# Patient Record
Sex: Female | Born: 1965 | Race: White | Hispanic: No | Marital: Married | Smoking: Never smoker
Health system: Southern US, Community
[De-identification: ages and names within clinical notes are randomized; demographics above are authoritative.]

## PROBLEM LIST (undated history)

## (undated) DIAGNOSIS — Z9882 Breast implant status: Secondary | ICD-10-CM

## (undated) DIAGNOSIS — R011 Cardiac murmur, unspecified: Secondary | ICD-10-CM

## (undated) DIAGNOSIS — K5790 Diverticulosis of intestine, part unspecified, without perforation or abscess without bleeding: Secondary | ICD-10-CM

## (undated) DIAGNOSIS — Z79890 Hormone replacement therapy (postmenopausal): Secondary | ICD-10-CM

## (undated) DIAGNOSIS — C4492 Squamous cell carcinoma of skin, unspecified: Secondary | ICD-10-CM

## (undated) DIAGNOSIS — S39012A Strain of muscle, fascia and tendon of lower back, initial encounter: Secondary | ICD-10-CM

## (undated) DIAGNOSIS — K219 Gastro-esophageal reflux disease without esophagitis: Secondary | ICD-10-CM

## (undated) DIAGNOSIS — N920 Excessive and frequent menstruation with regular cycle: Secondary | ICD-10-CM

## (undated) DIAGNOSIS — E039 Hypothyroidism, unspecified: Secondary | ICD-10-CM

## (undated) DIAGNOSIS — J45909 Unspecified asthma, uncomplicated: Secondary | ICD-10-CM

## (undated) HISTORY — DX: Squamous cell carcinoma of skin, unspecified: C44.92

## (undated) HISTORY — DX: Strain of muscle, fascia and tendon of lower back, initial encounter: S39.012A

## (undated) HISTORY — DX: Diverticulosis of intestine, part unspecified, without perforation or abscess without bleeding: K57.90

## (undated) HISTORY — DX: Breast implant status: Z98.82

## (undated) HISTORY — DX: Gastro-esophageal reflux disease without esophagitis: K21.9

## (undated) HISTORY — DX: Excessive and frequent menstruation with regular cycle: N92.0

## (undated) HISTORY — DX: Hormone replacement therapy: Z79.890

## (undated) HISTORY — DX: Hypothyroidism, unspecified: E03.9

## (undated) HISTORY — DX: Cardiac murmur, unspecified: R01.1

## (undated) HISTORY — DX: Unspecified asthma, uncomplicated: J45.909

---

## 1988-09-05 HISTORY — PX: PLACEMENT OF BREAST IMPLANTS: SHX6334

## 2010-11-16 ENCOUNTER — Other Ambulatory Visit (HOSPITAL_COMMUNITY)
Admission: RE | Admit: 2010-11-16 | Discharge: 2010-11-16 | Disposition: A | Discharge status: Home / Self Care | Payer: BC Managed Care – PPO | Source: Ambulatory Visit | Attending: Internal Medicine | Admitting: Internal Medicine

## 2010-11-16 DIAGNOSIS — Z01419 Encounter for gynecological examination (general) (routine) without abnormal findings: Secondary | ICD-10-CM | POA: Insufficient documentation

## 2010-11-17 ENCOUNTER — Other Ambulatory Visit: Payer: Self-pay | Admitting: Family Medicine

## 2010-11-17 DIAGNOSIS — Z1231 Encounter for screening mammogram for malignant neoplasm of breast: Secondary | ICD-10-CM

## 2011-06-21 ENCOUNTER — Emergency Department (HOSPITAL_COMMUNITY): Payer: BC Managed Care – PPO

## 2011-06-21 ENCOUNTER — Emergency Department (HOSPITAL_COMMUNITY)
Admission: EM | Admit: 2011-06-21 | Discharge: 2011-06-22 | Disposition: A | Discharge status: Home / Self Care | Payer: BC Managed Care – PPO | Attending: Emergency Medicine | Admitting: Emergency Medicine

## 2011-06-21 DIAGNOSIS — S93409A Sprain of unspecified ligament of unspecified ankle, initial encounter: Secondary | ICD-10-CM | POA: Insufficient documentation

## 2011-06-21 DIAGNOSIS — R011 Cardiac murmur, unspecified: Secondary | ICD-10-CM | POA: Insufficient documentation

## 2011-06-21 DIAGNOSIS — E039 Hypothyroidism, unspecified: Secondary | ICD-10-CM | POA: Insufficient documentation

## 2011-06-21 DIAGNOSIS — M542 Cervicalgia: Secondary | ICD-10-CM | POA: Insufficient documentation

## 2011-06-21 DIAGNOSIS — S139XXA Sprain of joints and ligaments of unspecified parts of neck, initial encounter: Secondary | ICD-10-CM | POA: Insufficient documentation

## 2011-06-21 DIAGNOSIS — W108XXA Fall (on) (from) other stairs and steps, initial encounter: Secondary | ICD-10-CM | POA: Insufficient documentation

## 2011-06-21 DIAGNOSIS — M25579 Pain in unspecified ankle and joints of unspecified foot: Secondary | ICD-10-CM | POA: Insufficient documentation

## 2011-06-21 DIAGNOSIS — Y92009 Unspecified place in unspecified non-institutional (private) residence as the place of occurrence of the external cause: Secondary | ICD-10-CM | POA: Insufficient documentation

## 2011-11-21 ENCOUNTER — Other Ambulatory Visit (HOSPITAL_COMMUNITY)
Admission: RE | Admit: 2011-11-21 | Discharge: 2011-11-21 | Disposition: A | Discharge status: Home / Self Care | Payer: BC Managed Care – PPO | Source: Ambulatory Visit | Attending: Family Medicine | Admitting: Family Medicine

## 2011-11-21 DIAGNOSIS — Z01419 Encounter for gynecological examination (general) (routine) without abnormal findings: Secondary | ICD-10-CM | POA: Insufficient documentation

## 2012-11-26 ENCOUNTER — Other Ambulatory Visit (HOSPITAL_COMMUNITY)
Admission: RE | Admit: 2012-11-26 | Discharge: 2012-11-26 | Disposition: A | Discharge status: Home / Self Care | Payer: BC Managed Care – PPO | Source: Ambulatory Visit | Attending: Family Medicine | Admitting: Family Medicine

## 2012-11-26 ENCOUNTER — Other Ambulatory Visit: Payer: Self-pay | Admitting: Family Medicine

## 2012-11-26 DIAGNOSIS — Z01419 Encounter for gynecological examination (general) (routine) without abnormal findings: Secondary | ICD-10-CM | POA: Insufficient documentation

## 2012-11-27 ENCOUNTER — Other Ambulatory Visit: Payer: Self-pay | Admitting: Family Medicine

## 2012-11-27 DIAGNOSIS — Z1231 Encounter for screening mammogram for malignant neoplasm of breast: Secondary | ICD-10-CM

## 2013-12-17 ENCOUNTER — Other Ambulatory Visit: Payer: Self-pay | Admitting: Family Medicine

## 2013-12-17 DIAGNOSIS — Z1231 Encounter for screening mammogram for malignant neoplasm of breast: Secondary | ICD-10-CM

## 2014-01-22 ENCOUNTER — Ambulatory Visit
Admission: RE | Admit: 2014-01-22 | Discharge: 2014-01-22 | Disposition: A | Discharge status: Home / Self Care | Payer: 59 | Source: Ambulatory Visit | Attending: Family Medicine | Admitting: Family Medicine

## 2014-01-22 ENCOUNTER — Encounter (INDEPENDENT_AMBULATORY_CARE_PROVIDER_SITE_OTHER): Payer: Self-pay

## 2014-01-22 DIAGNOSIS — Z1231 Encounter for screening mammogram for malignant neoplasm of breast: Secondary | ICD-10-CM

## 2014-03-27 ENCOUNTER — Encounter (INDEPENDENT_AMBULATORY_CARE_PROVIDER_SITE_OTHER): Payer: Self-pay

## 2014-03-27 ENCOUNTER — Other Ambulatory Visit: Payer: Self-pay | Admitting: Family Medicine

## 2014-03-27 ENCOUNTER — Ambulatory Visit
Admission: RE | Admit: 2014-03-27 | Discharge: 2014-03-27 | Disposition: A | Discharge status: Home / Self Care | Payer: 59 | Source: Ambulatory Visit | Attending: Family Medicine | Admitting: Family Medicine

## 2014-03-27 DIAGNOSIS — M79641 Pain in right hand: Secondary | ICD-10-CM

## 2014-06-27 ENCOUNTER — Other Ambulatory Visit: Payer: Self-pay

## 2014-08-11 ENCOUNTER — Other Ambulatory Visit: Payer: Self-pay | Admitting: Dermatology

## 2014-11-19 ENCOUNTER — Other Ambulatory Visit: Payer: Self-pay | Admitting: Dermatology

## 2014-12-11 ENCOUNTER — Other Ambulatory Visit: Payer: Self-pay | Admitting: Obstetrics and Gynecology

## 2015-10-18 IMAGING — CR DG HAND COMPLETE 3+V*R*
3 series · 3 of 3 positions shown · non-contrast
Comparison: None.

CLINICAL DATA: Right hand pain

EXAM:
RIGHT HAND - COMPLETE 3+ VIEW

[x hand pa right]
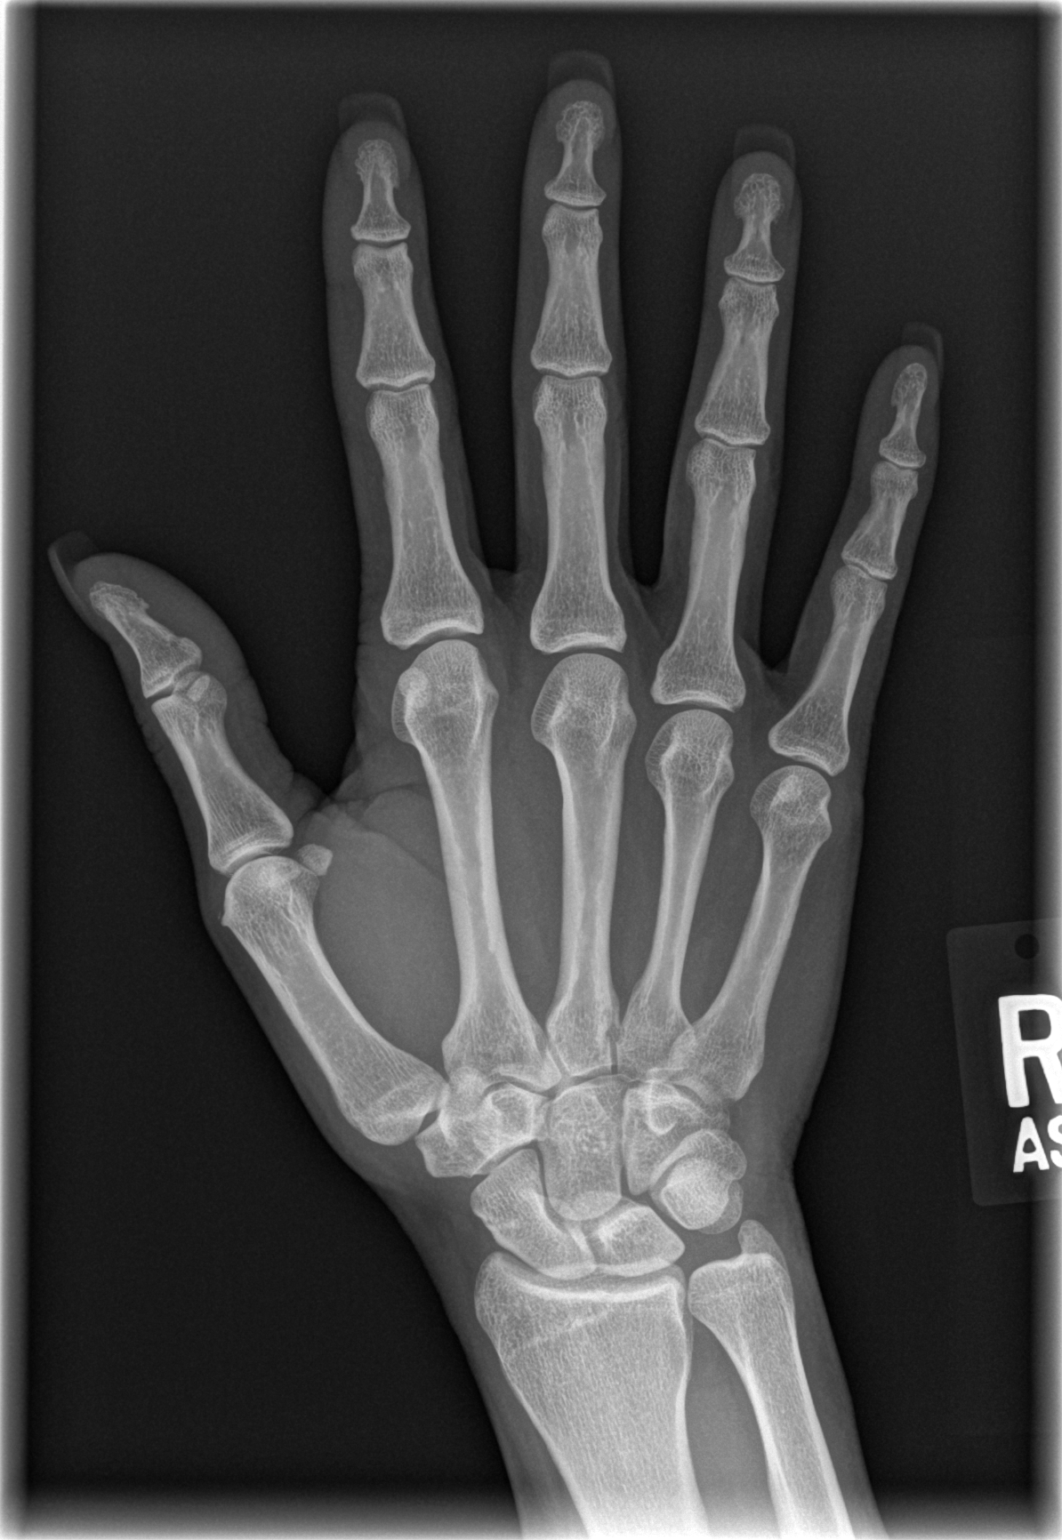

[x hand oblique right]
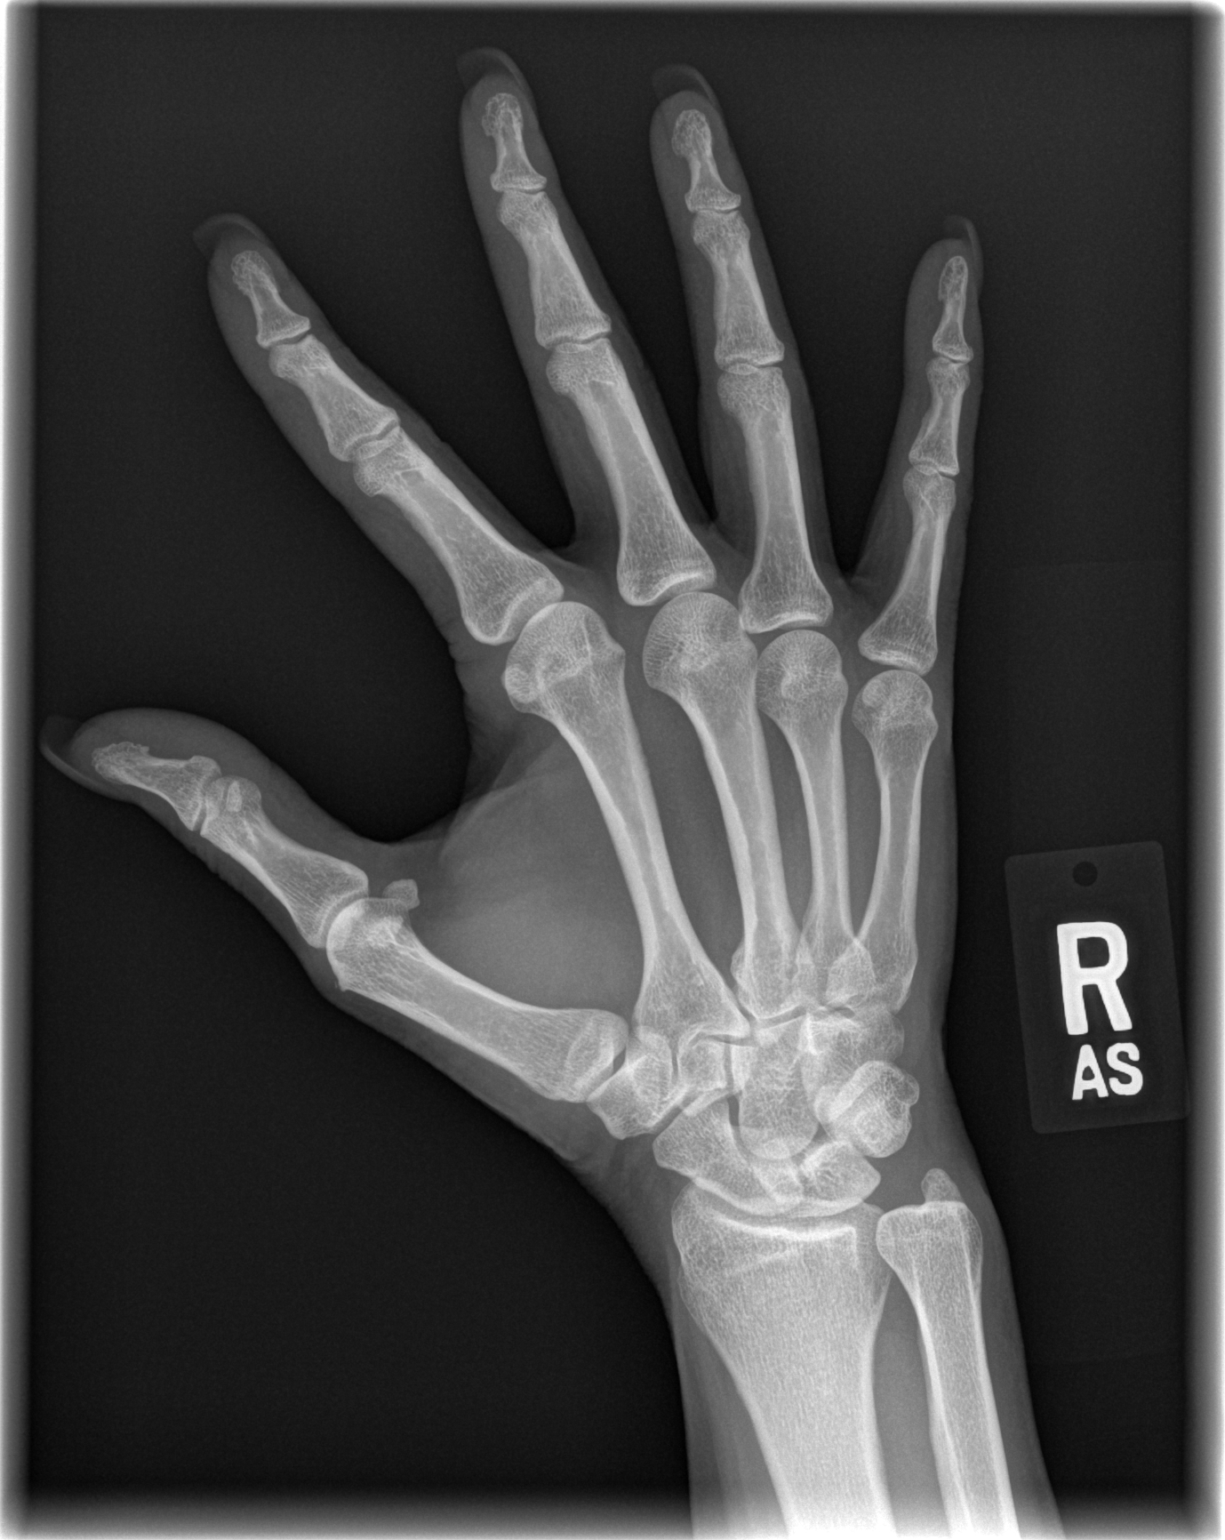

[x hand lat right]
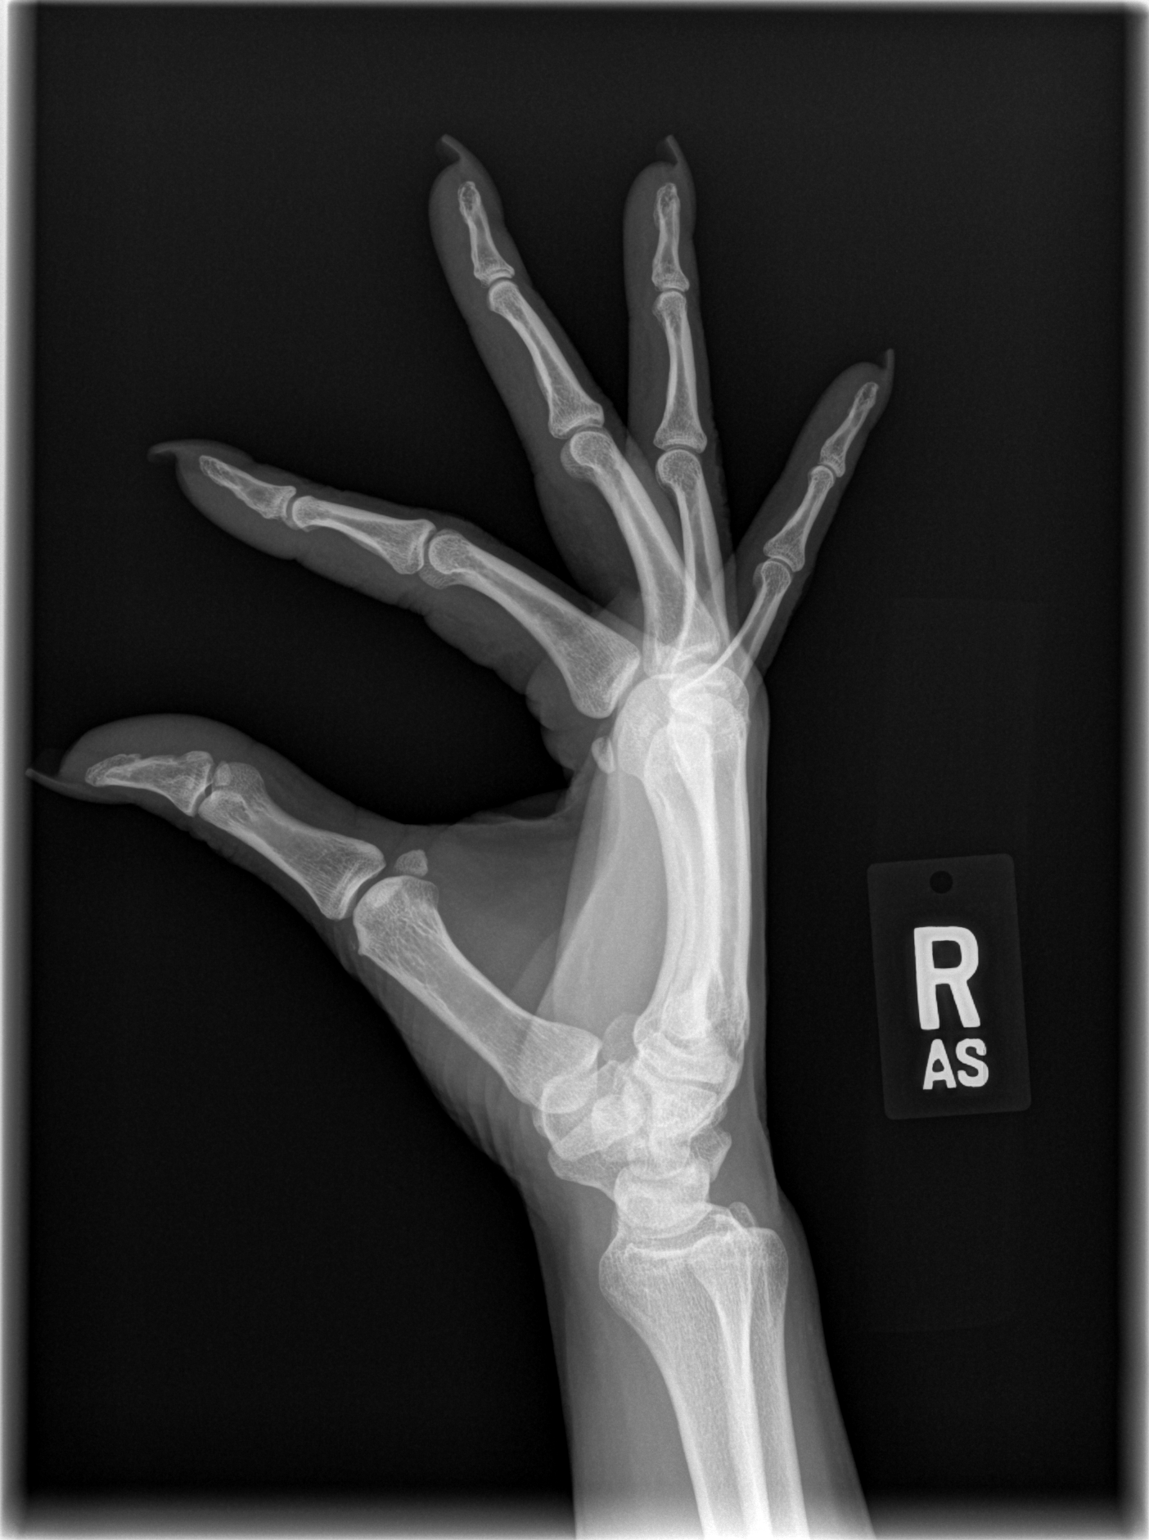

[3 of 3 positions shown; findings below may reference images not displayed]

FINDINGS: There is no evidence of fracture or dislocation. Tiny marginal
osteophyte of the first metacarpal head. Soft tissues are
unremarkable.
IMPRESSION: No acute osseous injury of the right hand.

## 2015-12-24 ENCOUNTER — Other Ambulatory Visit: Payer: Self-pay | Admitting: Obstetrics and Gynecology

## 2015-12-24 LAB — HM PAP SMEAR: HM PAP: NORMAL

## 2016-12-29 ENCOUNTER — Other Ambulatory Visit: Payer: Self-pay

## 2016-12-29 MED ORDER — MULTIVITAMIN ADULTS PO TABS: 1.0000 | ORAL_TABLET | Freq: Every day | ORAL | Status: AC

## 2016-12-29 MED ORDER — LEVOTHYROXINE SODIUM 25 MCG PO TABS: 25.0000 ug | ORAL_TABLET | Freq: Every day | ORAL | Status: DC

## 2016-12-29 MED ORDER — NORETHINDRONE-ETH ESTRADIOL 1-5 MG-MCG PO TABS: 1.0000 | ORAL_TABLET | Freq: Every day | ORAL | Status: DC

## 2016-12-29 MED ORDER — HYDROCODONE-ACETAMINOPHEN 5-325 MG PO TABS: 1.0000 | ORAL_TABLET | Freq: Four times a day (QID) | ORAL | 0 refills | Status: DC | PRN

## 2016-12-29 MED ORDER — ESOMEPRAZOLE MAGNESIUM 40 MG PO CPDR: 40.0000 mg | DELAYED_RELEASE_CAPSULE | Freq: Every day | ORAL | Status: DC

## 2016-12-29 MED ORDER — FLUOCINONIDE 0.05 % EX OINT: 1.0000 "application " | TOPICAL_OINTMENT | Freq: Two times a day (BID) | CUTANEOUS | 0 refills | Status: AC

## 2017-01-20 ENCOUNTER — Other Ambulatory Visit: Payer: Self-pay | Admitting: Obstetrics and Gynecology

## 2017-01-20 DIAGNOSIS — Z1231 Encounter for screening mammogram for malignant neoplasm of breast: Secondary | ICD-10-CM | POA: Diagnosis not present

## 2017-01-20 DIAGNOSIS — Z01419 Encounter for gynecological examination (general) (routine) without abnormal findings: Secondary | ICD-10-CM | POA: Diagnosis not present

## 2017-01-31 ENCOUNTER — Telehealth: Payer: Self-pay | Admitting: Emergency Medicine

## 2017-01-31 ENCOUNTER — Encounter: Payer: Self-pay | Admitting: Emergency Medicine

## 2017-01-31 NOTE — Telephone Encounter (Signed)
Patient will bring records for mammogram and colonoscopy   Patient believes she is due for TDap and is willing to receive vaccine at appointment.

## 2017-02-02 ENCOUNTER — Encounter: Payer: Self-pay | Admitting: Family Medicine

## 2017-02-02 ENCOUNTER — Ambulatory Visit (INDEPENDENT_AMBULATORY_CARE_PROVIDER_SITE_OTHER): Payer: 59 | Admitting: Family Medicine

## 2017-02-02 VITALS — BP 116/64 | HR 69 | Temp 98.2°F | Ht 69.0 in | Wt 149.4 lb

## 2017-02-02 DIAGNOSIS — E039 Hypothyroidism, unspecified: Secondary | ICD-10-CM | POA: Diagnosis not present

## 2017-02-02 DIAGNOSIS — S39012A Strain of muscle, fascia and tendon of lower back, initial encounter: Secondary | ICD-10-CM | POA: Insufficient documentation

## 2017-02-02 DIAGNOSIS — Z79890 Hormone replacement therapy (postmenopausal): Secondary | ICD-10-CM | POA: Diagnosis not present

## 2017-02-02 DIAGNOSIS — L700 Acne vulgaris: Secondary | ICD-10-CM

## 2017-02-02 DIAGNOSIS — N921 Excessive and frequent menstruation with irregular cycle: Secondary | ICD-10-CM | POA: Insufficient documentation

## 2017-02-02 DIAGNOSIS — S39012S Strain of muscle, fascia and tendon of lower back, sequela: Secondary | ICD-10-CM | POA: Diagnosis not present

## 2017-02-02 DIAGNOSIS — C4492 Squamous cell carcinoma of skin, unspecified: Secondary | ICD-10-CM | POA: Diagnosis not present

## 2017-02-02 DIAGNOSIS — K219 Gastro-esophageal reflux disease without esophagitis: Secondary | ICD-10-CM | POA: Diagnosis not present

## 2017-02-02 DIAGNOSIS — Z9882 Breast implant status: Secondary | ICD-10-CM | POA: Insufficient documentation

## 2017-02-02 DIAGNOSIS — R011 Cardiac murmur, unspecified: Secondary | ICD-10-CM

## 2017-02-02 LAB — CBC WITH DIFFERENTIAL/PLATELET
Basophils Absolute: 0 10*3/uL (ref 0.0–0.1)
Basophils Relative: 0.5 % (ref 0.0–3.0)
Eosinophils Absolute: 0.1 10*3/uL (ref 0.0–0.7)
Eosinophils Relative: 1.2 % (ref 0.0–5.0)
HCT: 43 % (ref 36.0–46.0)
Hemoglobin: 14.3 g/dL (ref 12.0–15.0)
Lymphocytes Relative: 31.6 % (ref 12.0–46.0)
Lymphs Abs: 2.1 10*3/uL (ref 0.7–4.0)
MCHC: 33.2 g/dL (ref 30.0–36.0)
MCV: 89.6 fl (ref 78.0–100.0)
Monocytes Absolute: 0.6 10*3/uL (ref 0.1–1.0)
Monocytes Relative: 10 % (ref 3.0–12.0)
Neutro Abs: 3.7 10*3/uL (ref 1.4–7.7)
Neutrophils Relative %: 56.7 % (ref 43.0–77.0)
Platelets: 220 10*3/uL (ref 150.0–400.0)
RBC: 4.8 Mil/uL (ref 3.87–5.11)
RDW: 14.3 % (ref 11.5–15.5)
WBC: 6.5 10*3/uL (ref 4.0–10.5)

## 2017-02-02 LAB — LIPID PANEL
Cholesterol: 182 mg/dL (ref 0–200)
HDL: 52.3 mg/dL (ref >39.00)
LDL Cholesterol: 106 mg/dL — ABNORMAL HIGH (ref 0–99)
NonHDL: 129.52
Total CHOL/HDL Ratio: 3
Triglycerides: 117 mg/dL (ref 0.0–149.0)
VLDL: 23.4 mg/dL (ref 0.0–40.0)

## 2017-02-02 LAB — COMPREHENSIVE METABOLIC PANEL
ALT: 14 U/L (ref 0–35)
AST: 15 U/L (ref 0–37)
Albumin: 4.2 g/dL (ref 3.5–5.2)
Alkaline Phosphatase: 57 U/L (ref 39–117)
BUN: 18 mg/dL (ref 6–23)
CO2: 27 mEq/L (ref 19–32)
Calcium: 9.6 mg/dL (ref 8.4–10.5)
Chloride: 105 mEq/L (ref 96–112)
Creatinine, Ser: 1.16 mg/dL (ref 0.40–1.20)
GFR: 51.92 mL/min — ABNORMAL LOW (ref >60.00)
Glucose, Bld: 93 mg/dL (ref 70–99)
Potassium: 4.4 mEq/L (ref 3.5–5.1)
Sodium: 138 mEq/L (ref 135–145)
Total Bilirubin: 0.6 mg/dL (ref 0.2–1.2)
Total Protein: 7.4 g/dL (ref 6.0–8.3)

## 2017-02-02 LAB — TSH: TSH: 3.73 u[IU]/mL (ref 0.35–4.50)

## 2017-02-02 NOTE — Assessment & Plan Note (Signed)
Stable and without concerns today.

## 2017-02-02 NOTE — Assessment & Plan Note (Signed)
The patient travels often. This includes driving 1-56 hours over a 2 day period of time. Sometimes, she feels a strain in her right low back. She does take anti-inflammatories and stretches. Sometimes, she needs Norco for her severe pain. She uses this sparingly. She does not need a refill today.

## 2017-02-02 NOTE — Assessment & Plan Note (Signed)
Mammogram per gynecology.

## 2017-02-02 NOTE — Assessment & Plan Note (Signed)
Controlled with hormone replacement therapy.

## 2017-02-02 NOTE — Assessment & Plan Note (Signed)
Followed by gynecology. She has an upcoming ultrasound to evaluate the uterine lining. She has not been replacement therapy. Doing well.

## 2017-02-02 NOTE — Progress Notes (Signed)
Subjective:  Water quality scientist, CMA, acting as scribe for Dr. Juleen China.  Maria Cobb is a 51 y.o. female and is here to establish care.  HPI:  1. Hypothyroidism. Stable on thyroxine 25 mcg for years. She denies any skin changes, weight gain or weight loss, or bowel issues.     2. Gastroesophageal reflux disease. Stable on Nexium. The patient denies any nausea, vomiting, reflux symptoms. She has no melena.   3. Menorrhagia with irregular cycle. Stable on hormone replacement therapy. She is followed by gynecology.   8. Strain of lumbar region. Intermittent. Patient travels often and finds that she has issues with her low back after traveling for several hours, especially in the car. This pain is usually controlled With anti-inflammatories, however sometimes she needs a small dose of Norco. She uses this sparingly and rarely needs a refill.   9. Acne vulgaris. Adult acne that is controlled using the hormone replacement therapy.     Health Maintenance Due  Topic Date Due  . Hepatitis C Screening  September 30, 1965  . HIV Screening  01/04/1979   PMHx, SurgHx, SocialHx, Medications, and Allergies were reviewed in the Visit Navigator and updated as appropriate.   Past Medical History:  Diagnosis Date  . GERD (gastroesophageal reflux disease)   . Heart murmur   . History of bilateral breast implants   . Hypothyroidism   . Lumbar spine strain   . Menorrhagia   . Postmenopausal HRT (hormone replacement therapy)   . SCC (squamous cell carcinoma)    Past Surgical History:  Procedure Laterality Date  . BREAST SURGERY     Family History  Problem Relation Age of Onset  . Heart disease Mother   . High Cholesterol Mother   . Stroke Maternal Grandfather    Social History  Substance Use Topics  . Smoking status: Never Smoker  . Smokeless tobacco: Never Used  . Alcohol use Yes     Comment: 1 weekly   Review of Systems:   Review of Systems  Constitutional: Negative for chills and  fever.  HENT: Negative for congestion, ear pain and sore throat.   Eyes: Negative for blurred vision and pain.  Respiratory: Negative for cough and shortness of breath.   Cardiovascular: Negative for chest pain and palpitations.  Gastrointestinal: Negative for abdominal pain, nausea and vomiting.  Genitourinary: Negative for frequency.  Musculoskeletal: Negative for back pain and neck pain.  Skin: Negative for rash.  Neurological: Negative for dizziness, loss of consciousness, weakness and headaches.  Endo/Heme/Allergies: Does not bruise/bleed easily.  Psychiatric/Behavioral: Negative for depression. The patient is not nervous/anxious.    Objective:   Vitals:   02/02/17 0829  BP: 116/64  Pulse: 69  Temp: 98.2 F (36.8 C)   Body mass index is 22.06 kg/m.  General: Cooperative, alert and oriented, well developed, well nourished, in no acute distress. HEENT: Pupils equal round reactive light and extraocular movements intact. Conjunctivae and lids unremarkable, funduscopic exam and visual fields not performed. No pallor or cyanosis, dentition good. Neck: No thyromegaly. No JVD. No carotid bruits.  Cardiovascular: Regular rhythm. S1 normal. S2 normal. No S3 or S4. Apical impulse not displaced. No murmurs. No gallops. No rubs. Lungs: Clear bilaterally without rales, rhonchi, or wheezing. Normal symmetry, no tenderness to palpation, normal respiratory excursion, no use of accessory muscles. Abdomen: Soft, nontender, no masses or hepatosplenomegaly. Extremities: No clubbing, cyanosis, erythema. No edema. Normal muscle strength and tone. Pulses: 2+ radial, 2+ femoral pulses, 2+ pedal pulses. Skin: Reveals no  rashes.  Neurologic: Cranial nerves are intact with no focal deficits.  Psychiatric: Alert and oriented to person place and time.  Assessment/Plan:   Postmenopausal HRT (hormone replacement therapy) Followed by gynecology. She has an upcoming ultrasound to evaluate the uterine  lining. She has not been replacement therapy. Doing well.  History of bilateral breast implants Mammogram per gynecology.  Lumbar spine strain The patient travels often. This includes driving 6-16 hours over a 2 day period of time. Sometimes, she feels a strain in her right low back. She does take anti-inflammatories and stretches. Sometimes, she needs Norco for her severe pain. She uses this sparingly. She does not need a refill today.  Acne vulgaris Controlled with hormone replacement therapy.  Hypothyroidism She has been on the same dose of levothyroxine for years. We'll check TSH today.  Gastroesophageal reflux disease Stable and without concerns today.  Orders Placed This Encounter  Procedures  . CBC with Differential/Platelet  . Comprehensive metabolic panel  . Lipid panel  . TSH   . Reviewed expectations re: course of current medical issues. . Discussed self-management of symptoms. . Outlined signs and symptoms indicating need for more acute intervention. . Patient verbalized understanding and all questions were answered. Marland Kitchen Health Maintenance issues including appropriate healthy diet, exercise, and smoking avoidance were discussed with patient. . See orders for this visit as documented in the electronic medical record. . Patient received an After Visit Summary.  CMA served as Education administrator during this visit. History, Physical, and Plan performed by medical provider. The above documentation has been reviewed and is accurate and complete. Briscoe Deutscher, D.O.  Briscoe Deutscher, DO [place], [place] 02/02/2017  Future Appointments Date Time Provider [place]  02/05/2018 8:30 AM Briscoe Deutscher, DO [facility] None

## 2017-02-02 NOTE — Assessment & Plan Note (Signed)
She has been on the same dose of levothyroxine for years. We'll check TSH today.

## 2017-02-03 ENCOUNTER — Telehealth: Payer: Self-pay | Admitting: Family Medicine

## 2017-02-03 NOTE — Telephone Encounter (Signed)
Patient returning Donna's phone call. Transferred call to Butch Penny.

## 2017-02-06 NOTE — Telephone Encounter (Signed)
Spoke to pt, see result note.

## 2017-02-09 DIAGNOSIS — Z6822 Body mass index (BMI) 22.0-22.9, adult: Secondary | ICD-10-CM | POA: Diagnosis not present

## 2017-02-09 DIAGNOSIS — N926 Irregular menstruation, unspecified: Secondary | ICD-10-CM | POA: Diagnosis not present

## 2017-02-09 DIAGNOSIS — E039 Hypothyroidism, unspecified: Secondary | ICD-10-CM | POA: Diagnosis not present

## 2017-02-23 ENCOUNTER — Ambulatory Visit (INDEPENDENT_AMBULATORY_CARE_PROVIDER_SITE_OTHER): Payer: 59 | Admitting: Physician Assistant

## 2017-02-23 ENCOUNTER — Encounter: Payer: Self-pay | Admitting: Physician Assistant

## 2017-02-23 ENCOUNTER — Encounter: Payer: Self-pay | Admitting: Gastroenterology

## 2017-02-23 ENCOUNTER — Telehealth: Payer: Self-pay | Admitting: Family Medicine

## 2017-02-23 VITALS — BP 134/80 | HR 72 | Temp 98.1°F | Wt 145.8 lb

## 2017-02-23 DIAGNOSIS — K219 Gastro-esophageal reflux disease without esophagitis: Secondary | ICD-10-CM

## 2017-02-23 MED ORDER — GI COCKTAIL ~~LOC~~
30.0000 mL | Freq: Once | ORAL | Status: AC
  Administered 2017-02-23: 30 mL via ORAL

## 2017-02-23 MED ORDER — PANTOPRAZOLE SODIUM 40 MG PO TBEC: 40.0000 mg | DELAYED_RELEASE_TABLET | Freq: Every day | ORAL | 3 refills | Status: DC

## 2017-02-23 NOTE — Progress Notes (Signed)
Maria Cobb is a 51 y.o. female here for a new problem.  SCRIBE Menno acting as Education administrator for [employer], [place].   History of Present Illness:   Chief Complaint  Patient presents with  . Gastroesophageal Reflux    HPI  She endorses a long history of GERD, has been on Nexium for at least 11 years. She has seen a GI doctor in the past, Dr. Michail Sermon, and had an upper EGD about 6-7 years ago, she states that she was told that she had some "irritation of her stomach." I do not have records of this. She started [employer] in January. Since that time she has been eating all of their food products, including processed, pre-packaged goods. She does note that both salad and yogurt have triggered her symptoms. She states that it hurts to sit and lay down.  She has not been over-eating, denies unintentional weight loss, vomiting, rectal bleeding.  PMHx, SurgHx, SocialHx, Medications, and Allergies were reviewed in the Visit Navigator and updated as appropriate.  Current Medications:   Current Outpatient Prescriptions:  .  esomeprazole (NEXIUM) 40 MG capsule, Take 1 capsule (40 mg total) by mouth daily at 12 noon., Disp: , Rfl:  .  fluocinonide ointment (LIDEX) 0.93 %, Apply 1 application topically 2 (two) times daily., Disp: 30 g, Rfl: 0 .  HYDROcodone-acetaminophen (NORCO/VICODIN) 5-325 MG tablet, Take 1 tablet by mouth every 6 (six) hours as needed for moderate pain., Disp: 30 tablet, Rfl: 0 .  levothyroxine (SYNTHROID, LEVOTHROID) 25 MCG tablet, Take 1 tablet (25 mcg total) by mouth daily before breakfast., Disp: , Rfl:  .  Multiple Vitamins-Minerals (MULTIVITAMIN ADULTS) TABS, Take 1 tablet by mouth daily., Disp: , Rfl:  .  norethindrone-ethinyl estradiol (FEMHRT 1/5) 1-5 MG-MCG TABS tablet, Take 1 tablet by mouth daily., Disp: 28 tablet, Rfl:  .  pantoprazole (PROTONIX) 40 MG tablet, Take 1 tablet (40 mg total) by mouth daily., Disp: 30 tablet, Rfl: 3   Review of  Systems:   Review of Systems  Constitutional: Negative for chills, fever, malaise/fatigue and weight loss.  Respiratory: Negative for cough and shortness of breath.   Cardiovascular: Negative for chest pain and leg swelling.  Gastrointestinal: Positive for abdominal pain and heartburn. Negative for blood in stool, constipation, diarrhea, nausea and vomiting.  Genitourinary: Negative for dysuria and urgency.  Neurological: Negative for dizziness and headaches.    Vitals:   Vitals:   02/23/17 1323  BP: 134/80  Pulse: 72  Temp: 98.1 F (36.7 C)  TempSrc: Oral  SpO2: 99%  Weight: 145 lb 12.8 oz (66.1 kg)     Body mass index is 21.53 kg/m.  Physical Exam:   Physical Exam  Constitutional: She appears well-developed. She is cooperative.  Non-toxic appearance. She does not have a sickly appearance. She does not appear ill. No distress.  Cardiovascular: Normal rate, regular rhythm, S1 normal, S2 normal, normal heart sounds and normal pulses.   No LE edema  Pulmonary/Chest: Effort normal and breath sounds normal.  Abdominal: Soft. Normal appearance and bowel sounds are normal. There is tenderness in the epigastric area. There is no rigidity, no rebound, no guarding, no CVA tenderness, no tenderness at McBurney's point and negative Murphy's sign.  Neurological: She is alert.  Nursing note and vitals reviewed.   EKG tracing is personally reviewed.  EKG notes NSR.  No acute changes.   Assessment and Plan:    [name] was seen today for gastroesophageal reflux.  Diagnoses and all  orders for this visit:  Gastroesophageal reflux disease, esophagitis presence not specified -     EKG 12-Lead -     gi cocktail (Maalox,Lidocaine,Donnatal); Take 30 mLs by mouth once. -     Ambulatory referral to Gastroenterology  Other orders -     pantoprazole (PROTONIX) 40 MG tablet; Take 1 tablet (40 mg total) by mouth daily.   EKG reviewed and appears normal. I do not have one to compare to. GI  cocktail provided some relief. We are going to have her discontinue her Nexium and start Protonix. I told her that we would send her to gastroenterology. She is going to let us know if her symptoms do not improve. I advised her that if her symptoms change and she develops more cardiac symptoms such as chest pain, pain radiating to left arm or jaw pain that she needs to go to the emergency room. Patient verbalized understanding.  . Reviewed expectations re: course of current medical issues. . Discussed self-management of symptoms. . Outlined signs and symptoms indicating need for more acute intervention. . Patient verbalized understanding and all questions were answered. . See orders for this visit as documented in the electronic medical record. . Patient received an After-Visit Summary.  CMA or LPN served as scribe during this visit. History, Physical, and Plan performed by medical provider. Documentation and orders reviewed and attested to.  Maria Coke, PA-C

## 2017-02-23 NOTE — Telephone Encounter (Signed)
Ok to place referral.

## 2017-02-23 NOTE — Telephone Encounter (Signed)
Patient asking to be referred to specialist for acid reflux/ indigestion. States she is kept up at night and extreme discomfort throughout the day.

## 2017-02-23 NOTE — Patient Instructions (Signed)
Start 40 mg protonix daily.  Stop nexium.  You will be contacted about your referral to GI.   Food Choices for Gastroesophageal Reflux Disease, Adult When you have gastroesophageal reflux disease (GERD), the foods you eat and your eating habits are very important. Choosing the right foods can help ease the discomfort of GERD. Consider working with a diet and nutrition specialist (dietitian) to help you make healthy food choices. What general guidelines should I follow? Eating plan  Choose healthy foods low in fat, such as fruits, vegetables, whole grains, low-fat dairy products, and lean meat, fish, and poultry.  Eat frequent, small meals instead of three large meals each day. Eat your meals slowly, in a relaxed setting. Avoid bending over or lying down until 2-3 hours after eating.  Limit high-fat foods such as fatty meats or fried foods.  Limit your intake of oils, butter, and shortening to less than 8 teaspoons each day.  Avoid the following: ? Foods that cause symptoms. These may be different for different people. Keep a food diary to keep track of foods that cause symptoms. ? Alcohol. ? Drinking large amounts of liquid with meals. ? Eating meals during the 2-3 hours before bed.  Cook foods using methods other than frying. This may include baking, grilling, or broiling. Lifestyle   Maintain a healthy weight. Ask your health care provider what weight is healthy for you. If you need to lose weight, work with your health care provider to do so safely.  Exercise for at least 30 minutes on 5 or more days each week, or as told by your health care provider.  Avoid wearing clothes that fit tightly around your waist and chest.  Do not use any products that contain nicotine or tobacco, such as cigarettes and e-cigarettes. If you need help quitting, ask your health care provider.  Sleep with the head of your bed raised. Use a wedge under the mattress or blocks under the bed frame to  raise the head of the bed. What foods are not recommended? The items listed may not be a complete list. Talk with your dietitian about what dietary choices are best for you. Grains Pastries or quick breads with added fat. [place] toast. Vegetables Deep fried vegetables. [place] fries. Any vegetables prepared with added fat. Any vegetables that cause symptoms. For some people this may include tomatoes and tomato products, chili peppers, onions and garlic, and horseradish. Fruits Any fruits prepared with added fat. Any fruits that cause symptoms. For some people this may include citrus fruits, such as oranges, grapefruit, pineapple, and lemons. Meats and other protein foods High-fat meats, such as fatty beef or pork, hot dogs, ribs, ham, sausage, salami and bacon. Fried meat or protein, including fried fish and fried chicken. Nuts and nut butters. Dairy Whole milk and chocolate milk. Sour cream. Cream. Ice cream. Cream cheese. Milk shakes. Beverages Coffee and tea, with or without caffeine. Carbonated beverages. Sodas. Energy drinks. Fruit juice made with acidic fruits (such as orange or grapefruit). Tomato juice. Alcoholic drinks. Fats and oils Butter. Margarine. Shortening. Ghee. Sweets and desserts Chocolate and cocoa. Donuts. Seasoning and other foods Pepper. Peppermint and spearmint. Any condiments, herbs, or seasonings that cause symptoms. For some people, this may include curry, hot sauce, or vinegar-based salad dressings. Summary  When you have gastroesophageal reflux disease (GERD), food and lifestyle choices are very important to help ease the discomfort of GERD.  Eat frequent, small meals instead of three large meals each day. Eat your  meals slowly, in a relaxed setting. Avoid bending over or lying down until 2-3 hours after eating.  Limit high-fat foods such as fatty meat or fried foods. This information is not intended to replace advice given to you by your health care provider.  Make sure you discuss any questions you have with your health care provider. Document Released: 08/22/2005 Document Revised: 08/23/2016 Document Reviewed: 08/23/2016 Elsevier Interactive Patient Education  2017 [employer].

## 2017-02-24 NOTE — Telephone Encounter (Signed)
Okay referral.

## 2017-02-27 NOTE — Telephone Encounter (Signed)
Referral to GI was placed on 02/23/2017 by Inda Coke, PA.

## 2017-04-06 ENCOUNTER — Ambulatory Visit (INDEPENDENT_AMBULATORY_CARE_PROVIDER_SITE_OTHER): Payer: 59 | Admitting: Gastroenterology

## 2017-04-06 ENCOUNTER — Encounter: Payer: Self-pay | Admitting: Gastroenterology

## 2017-04-06 VITALS — BP 110/70 | HR 68 | Ht 69.0 in | Wt 148.0 lb

## 2017-04-06 DIAGNOSIS — K219 Gastro-esophageal reflux disease without esophagitis: Secondary | ICD-10-CM

## 2017-04-06 DIAGNOSIS — Z1211 Encounter for screening for malignant neoplasm of colon: Secondary | ICD-10-CM

## 2017-04-06 DIAGNOSIS — R1013 Epigastric pain: Secondary | ICD-10-CM | POA: Diagnosis not present

## 2017-04-06 DIAGNOSIS — Z1212 Encounter for screening for malignant neoplasm of rectum: Secondary | ICD-10-CM

## 2017-04-06 MED ORDER — GI COCKTAIL ~~LOC~~: ORAL | 0 refills | Status: DC

## 2017-04-06 NOTE — Patient Instructions (Signed)
We have sent the following medications to your pharmacy for you to pick up at your convenience: GI cocktail.   Patient advised to avoid spicy, acidic, citrus, chocolate, mints, fruit and fruit juices.  Limit the intake of caffeine, alcohol and Soda.  Don't exercise too soon after eating.  Don't lie down within 3-4 hours of eating.  Elevate the head of your bed.  You have been scheduled for an abdominal ultrasound at [facility] (1st floor of hospital) on 04/18/17 at 8:30am. Please arrive 15 minutes prior to your appointment for registration. Make certain not to have anything to eat or drink 6 hours prior to your appointment. Should you need to reschedule your appointment, please contact radiology at [phone]. This test typically takes about 30 minutes to perform.  Normal BMI (Body Mass Index- based on height and weight) is between 19 and 25. Your BMI today is Body mass index is 21.86 kg/m. Marland Kitchen Please consider follow up  regarding your BMI with your Primary Care Provider.  Thank you for choosing me and [facility].  Pricilla Riffle. Dagoberto Ligas., MD., [facility] Regal

## 2017-04-06 NOTE — Progress Notes (Signed)
History of Present Illness: This is a 51 year old female referred by Maria Coke, PA for the evaluation of recurrent epigastric pain, GERD. Previously followed by Dr. Michail Sermon, [facility].  She relates intermittent episodes of severe epigastric pain and bloating. The pain radiates to her lower chest and will last for 1-2 days. It is associated with vomiting. She states symptoms occurred when she was traveling in [place] a couple years ago and an ultrasound showed cholelithiasis. The symptoms have occurred about 2-3 times per year for the past several years. She has chronic GERD and her symptoms are generally well controlled on daily PPI. She states she underwent colonoscopy by Dr. Michail Sermon in 2015 for routine screening and it was normal. She had an upper endoscopy performed by Dr. Michail Sermon 2012 and relates minimal "irritation". She has lost about 20 pounds by intention over the past several months on a new weight loss program. CMP and CBC in May 2018 unremarkable except for slightly decreased GFR. No other gastrointestinal complaints. Denies weight loss, constipation, diarrhea, change in stool caliber, melena, hematochezia, dysphagia, chest pain.    Allergies  Allergen Reactions  . Tape Rash   Outpatient Medications Prior to Visit  Medication Sig Dispense Refill  . fluocinonide ointment (LIDEX) 3.41 % Apply 1 application topically 2 (two) times daily. 30 g 0  . HYDROcodone-acetaminophen (NORCO/VICODIN) 5-325 MG tablet Take 1 tablet by mouth every 6 (six) hours as needed for moderate pain. 30 tablet 0  . Multiple Vitamins-Minerals (MULTIVITAMIN ADULTS) TABS Take 1 tablet by mouth daily.    . pantoprazole (PROTONIX) 40 MG tablet Take 1 tablet (40 mg total) by mouth daily. 30 tablet 3  . norethindrone-ethinyl estradiol (FEMHRT 1/5) 1-5 MG-MCG TABS tablet Take 1 tablet by mouth daily. (Patient taking differently: Take 2 tablets by mouth daily. ) 28 tablet   . esomeprazole (NEXIUM) 40 MG capsule  Take 1 capsule (40 mg total) by mouth daily at 12 noon.    Marland Kitchen levothyroxine (SYNTHROID, LEVOTHROID) 25 MCG tablet Take 1 tablet (25 mcg total) by mouth daily before breakfast. (Patient taking differently: Take 25 mcg by mouth at bedtime. )     No facility-administered medications prior to visit.    Past Medical History:  Diagnosis Date  . Asthma    mild  . GERD (gastroesophageal reflux disease)   . Heart murmur   . History of bilateral breast implants   . Hypothyroidism   . Lumbar spine strain   . Menorrhagia   . Postmenopausal HRT (hormone replacement therapy)   . SCC (squamous cell carcinoma)    Past Surgical History:  Procedure Laterality Date  . BREAST SURGERY  1990   implants   Social History   Social History  . Marital status: Married    Spouse name: N/A  . Number of children: 0  . Years of education: N/A   Occupational History  . sales    Social History Main Topics  . Smoking status: Never Smoker  . Smokeless tobacco: Never Used  . Alcohol use Yes     Comment: 1 weekly  . Drug use: No  . Sexual activity: Yes    Partners: Male    Birth control/ protection: Pill   Other Topics Concern  . None   Social History Narrative  . None   Family History  Problem Relation Age of Onset  . Heart disease Mother   . High Cholesterol Mother   . Stroke Maternal Grandfather   . Prostate  cancer Father   . Colon cancer Neg Hx   . Esophageal cancer Neg Hx   . Rectal cancer Neg Hx     Review of Systems: Pertinent positive and negative review of systems were noted in the above HPI section. All other review of systems were otherwise negative.    Physical Exam: General: Well developed, well nourished, no acute distress Head: Normocephalic and atraumatic Eyes:  sclerae anicteric, EOMI Ears: Normal auditory acuity Mouth: No deformity or lesions Neck: Supple, no masses or thyromegaly Lungs: Clear throughout to auscultation Heart: Regular rate and rhythm; no murmurs,  rubs or bruits Abdomen: Soft, non tender and non distended. No masses, hepatosplenomegaly or hernias noted. Normal Bowel sounds Rectal: not done Musculoskeletal: Symmetrical with no gross deformities  Skin: No lesions on visible extremities Pulses:  Normal pulses noted Extremities: No clubbing, cyanosis, edema or deformities noted Neurological: Alert oriented x 4, grossly nonfocal Cervical Nodes:  No significant cervical adenopathy Inguinal Nodes: No significant inguinal adenopathy Psychological:  Alert and cooperative. Normal mood and affect  Assessment and Recommendations:  1. Epigastric pain, recurrent, episodic, severe. Schedule abdominal ultrasound. Suspected symptomatic cholelithiasis. If ultrasound is unremarkable consider EGD to further evaluate.  2. Chronic GERD. Continue standard antireflux measures and Nexium 40 mg or Protonix 40 mg by mouth every morning. Request records from Dr. Michail Sermon. GI cocktail 30 mL every 4 hours when necessary for severe breakthrough symptoms.  3. CRC screening, average risk. Request records from Dr. Michail Sermon to determine timing of next colonoscopy.    cc: Maria Cobb, [address] [place], [place] [id number]

## 2017-04-13 ENCOUNTER — Telehealth: Payer: Self-pay | Admitting: Gastroenterology

## 2017-04-13 NOTE — Telephone Encounter (Signed)
Dr. Fuller Plan we will back to the office on 04/17/17 and will sign at that time.

## 2017-04-18 ENCOUNTER — Ambulatory Visit (HOSPITAL_COMMUNITY)
Admission: RE | Admit: 2017-04-18 | Discharge: 2017-04-18 | Disposition: A | Discharge status: Home / Self Care | Payer: 59 | Source: Ambulatory Visit | Attending: Gastroenterology | Admitting: Gastroenterology

## 2017-04-18 DIAGNOSIS — K219 Gastro-esophageal reflux disease without esophagitis: Secondary | ICD-10-CM | POA: Diagnosis not present

## 2017-04-18 DIAGNOSIS — R1013 Epigastric pain: Secondary | ICD-10-CM | POA: Insufficient documentation

## 2017-04-18 DIAGNOSIS — R938 Abnormal findings on diagnostic imaging of other specified body structures: Secondary | ICD-10-CM | POA: Insufficient documentation

## 2017-04-18 DIAGNOSIS — K8020 Calculus of gallbladder without cholecystitis without obstruction: Secondary | ICD-10-CM | POA: Insufficient documentation

## 2017-05-17 DIAGNOSIS — K8020 Calculus of gallbladder without cholecystitis without obstruction: Secondary | ICD-10-CM | POA: Diagnosis not present

## 2017-06-06 DIAGNOSIS — Z23 Encounter for immunization: Secondary | ICD-10-CM | POA: Diagnosis not present

## 2017-06-09 DIAGNOSIS — Z85828 Personal history of other malignant neoplasm of skin: Secondary | ICD-10-CM | POA: Diagnosis not present

## 2017-06-09 DIAGNOSIS — L243 Irritant contact dermatitis due to cosmetics: Secondary | ICD-10-CM | POA: Diagnosis not present

## 2017-06-09 DIAGNOSIS — L821 Other seborrheic keratosis: Secondary | ICD-10-CM | POA: Diagnosis not present

## 2017-06-12 ENCOUNTER — Ambulatory Visit: Payer: 59 | Admitting: Gastroenterology

## 2017-06-20 ENCOUNTER — Other Ambulatory Visit: Payer: Self-pay | Admitting: *Deleted

## 2017-06-20 MED ORDER — PANTOPRAZOLE SODIUM 40 MG PO TBEC: 40.0000 mg | DELAYED_RELEASE_TABLET | Freq: Every day | ORAL | 1 refills | Status: DC

## 2017-08-16 DIAGNOSIS — I788 Other diseases of capillaries: Secondary | ICD-10-CM | POA: Diagnosis not present

## 2017-08-16 DIAGNOSIS — L821 Other seborrheic keratosis: Secondary | ICD-10-CM | POA: Diagnosis not present

## 2017-08-16 DIAGNOSIS — Z85828 Personal history of other malignant neoplasm of skin: Secondary | ICD-10-CM | POA: Diagnosis not present

## 2017-08-22 ENCOUNTER — Ambulatory Visit: Payer: Self-pay | Admitting: General Surgery

## 2017-09-13 NOTE — Pre-Procedure Instructions (Signed)
Maria Cobb  09/13/2017      [facility] [address], [place] [address] Phone: [phone] Fax: [phone]    Your procedure is scheduled on January 15  Report to [facility] [place].M.  Call this number if you have problems the morning of surgery:  [phone]   Remember:  Do not eat food or drink liquids after midnight.  Continue all medications as directed by your physician except follow these medication instructions before surgery below  Please complete your PRE-SURGERY ENSURE that was given to before you leave your house the morning of surgery.  Please, if able, drink it in one setting. DO NOT SIP.   Take these medicines the morning of surgery with A SIP OF WATER  minocycline (MINOCIN,DYNACIN) norethindrone-ethinyl estradiol (FEMHRT 1/5)  pantoprazole (PROTONIX)   7 days prior to surgery STOP taking any Aspirin(unless otherwise instructed by your surgeon), Aleve, Naproxen, Ibuprofen, Motrin, Advil, Goody's, BC's, all herbal medications, fish oil, and all vitamins    Do not wear jewelry, make-up or nail polish.  Do not wear lotions, powders, or perfumes, or deodorant.  Do not shave 48 hours prior to surgery.  Men may shave face and neck.  Do not bring valuables to the hospital.  [facility] is not responsible for any belongings or valuables.  Contacts, dentures or bridgework may not be worn into surgery.  Leave your suitcase in the car.  After surgery it may be brought to your room.  For patients admitted to the hospital, discharge time will be determined by your treatment team.  Patients discharged the day of surgery will not be allowed to drive home.    Special instructions:   [facility]- Preparing For Surgery  Before surgery, you can play an important role. Because skin is not sterile, your skin needs to be as free of germs as possible. You can reduce  the number of germs on your skin by washing with CHG (chlorahexidine gluconate) Soap before surgery.  CHG is an antiseptic cleaner which kills germs and bonds with the skin to continue killing germs even after washing.  Please do not use if you have an allergy to CHG or antibacterial soaps. If your skin becomes reddened/irritated stop using the CHG.  Do not shave (including legs and underarms) for at least 48 hours prior to first CHG shower. It is OK to shave your face.  Please follow these instructions carefully.   1. Shower the NIGHT BEFORE SURGERY and the MORNING OF SURGERY with CHG.   2. If you chose to wash your hair, wash your hair first as usual with your normal shampoo.  3. After you shampoo, rinse your hair and body thoroughly to remove the shampoo.  4. Use CHG as you would any other liquid soap. You can apply CHG directly to the skin and wash gently with a scrungie or a clean washcloth.   5. Apply the CHG Soap to your body ONLY FROM THE NECK DOWN.  Do not use on open wounds or open sores. Avoid contact with your eyes, ears, mouth and genitals (private parts). Wash Face and genitals (private parts)  with your normal soap.  6. Wash thoroughly, paying special attention to the area where your surgery will be performed.  7. Thoroughly rinse your body with warm water from the neck down.  8. DO NOT shower/wash with your normal soap after using and rinsing off  the CHG Soap.  9. Pat yourself dry with a CLEAN TOWEL.  10. Wear CLEAN PAJAMAS to bed the night before surgery, wear comfortable clothes the morning of surgery  11. Place CLEAN SHEETS on your bed the night of your first shower and DO NOT SLEEP WITH PETS.    Day of Surgery: Do not apply any deodorants/lotions. Please wear clean clothes to the hospital/surgery center.      Please read over the following fact sheets that you were given.

## 2017-09-14 ENCOUNTER — Encounter (HOSPITAL_COMMUNITY)
Admission: RE | Admit: 2017-09-14 | Discharge: 2017-09-14 | Disposition: A | Discharge status: Home / Self Care | Payer: 59 | Source: Ambulatory Visit | Attending: General Surgery | Admitting: General Surgery

## 2017-09-14 ENCOUNTER — Encounter (HOSPITAL_COMMUNITY): Payer: Self-pay

## 2017-09-14 DIAGNOSIS — K8020 Calculus of gallbladder without cholecystitis without obstruction: Secondary | ICD-10-CM | POA: Diagnosis not present

## 2017-09-14 DIAGNOSIS — Z01812 Encounter for preprocedural laboratory examination: Secondary | ICD-10-CM | POA: Insufficient documentation

## 2017-09-14 LAB — BASIC METABOLIC PANEL
Anion gap: 7 (ref 5–15)
BUN: 18 mg/dL (ref 6–20)
CO2: 24 mmol/L (ref 22–32)
Calcium: 9.1 mg/dL (ref 8.9–10.3)
Chloride: 106 mmol/L (ref 101–111)
Creatinine, Ser: 1.17 mg/dL — ABNORMAL HIGH (ref 0.44–1.00)
GFR calc Af Amer: >60 mL/min (ref >60)
GFR, EST NON AFRICAN AMERICAN: 52 mL/min — AB (ref >60)
Glucose, Bld: 99 mg/dL (ref 65–99)
POTASSIUM: 4.2 mmol/L (ref 3.5–5.1)
Sodium: 137 mmol/L (ref 135–145)

## 2017-09-14 LAB — CBC
HCT: 40.9 % (ref 36.0–46.0)
Hemoglobin: 13.8 g/dL (ref 12.0–15.0)
MCH: 29.8 pg (ref 26.0–34.0)
MCHC: 33.7 g/dL (ref 30.0–36.0)
MCV: 88.3 fL (ref 78.0–100.0)
PLATELETS: 232 10*3/uL (ref 150–400)
RBC: 4.63 MIL/uL (ref 3.87–5.11)
RDW: 13.5 % (ref 11.5–15.5)
WBC: 7.4 10*3/uL (ref 4.0–10.5)

## 2017-09-14 LAB — HCG, SERUM, QUALITATIVE: Preg, Serum: NEGATIVE

## 2017-09-14 MED ORDER — CHLORHEXIDINE GLUCONATE CLOTH 2 % EX PADS: 6.0000 | MEDICATED_PAD | Freq: Once | CUTANEOUS | Status: DC

## 2017-09-18 NOTE — Anesthesia Preprocedure Evaluation (Addendum)
Anesthesia Evaluation  Patient identified by MRN, date of birth, ID band Patient awake    Reviewed: Allergy & Precautions, NPO status , Patient's Chart, lab work & pertinent test results  Airway Mallampati: II  TM Distance: >3 FB Neck ROM: Full    Dental no notable dental hx.    Pulmonary neg pulmonary ROS,    Pulmonary exam normal breath sounds clear to auscultation       Cardiovascular negative cardio ROS Normal cardiovascular exam Rhythm:Regular Rate:Normal     Neuro/Psych negative neurological ROS  negative psych ROS   GI/Hepatic negative GI ROS, Neg liver ROS, GERD  ,  Endo/Other  negative endocrine ROSHypothyroidism   Renal/GU negative Renal ROS  negative genitourinary   Musculoskeletal negative musculoskeletal ROS (+)   Abdominal   Peds  Hematology negative hematology ROS (+)   Anesthesia Other Findings   Reproductive/Obstetrics                             Anesthesia Physical Anesthesia Plan  ASA: II  Anesthesia Plan: General   Post-op Pain Management:    Induction:   PONV Risk Score and Plan: 4 or greater and Scopolamine patch - Pre-op, Dexamethasone, Ondansetron and Treatment may vary due to age or medical condition  Airway Management Planned: Oral ETT  Additional Equipment:   Intra-op Plan:   Post-operative Plan:   Informed Consent: I have reviewed the patients History and Physical, chart, labs and discussed the procedure including the risks, benefits and alternatives for the proposed anesthesia with the patient or authorized representative who has indicated his/her understanding and acceptance.   Dental advisory given  Plan Discussed with: CRNA and Anesthesiologist  Anesthesia Plan Comments:         Anesthesia Quick Evaluation

## 2017-09-19 ENCOUNTER — Ambulatory Visit (HOSPITAL_COMMUNITY): Payer: 59 | Admitting: Anesthesiology

## 2017-09-19 ENCOUNTER — Ambulatory Visit (HOSPITAL_COMMUNITY)
Admission: RE | Admit: 2017-09-19 | Discharge: 2017-09-19 | Disposition: A | Discharge status: Home / Self Care | Payer: 59 | Source: Ambulatory Visit | Attending: General Surgery | Admitting: General Surgery

## 2017-09-19 ENCOUNTER — Encounter (HOSPITAL_COMMUNITY): Payer: Self-pay | Admitting: *Deleted

## 2017-09-19 ENCOUNTER — Ambulatory Visit (HOSPITAL_COMMUNITY): Payer: 59

## 2017-09-19 ENCOUNTER — Encounter (HOSPITAL_COMMUNITY)
Admission: RE | Disposition: A | Discharge status: Home / Self Care | Payer: Self-pay | Source: Ambulatory Visit | Attending: General Surgery

## 2017-09-19 DIAGNOSIS — K8010 Calculus of gallbladder with chronic cholecystitis without obstruction: Secondary | ICD-10-CM | POA: Diagnosis not present

## 2017-09-19 DIAGNOSIS — Z888 Allergy status to other drugs, medicaments and biological substances status: Secondary | ICD-10-CM | POA: Diagnosis not present

## 2017-09-19 DIAGNOSIS — R011 Cardiac murmur, unspecified: Secondary | ICD-10-CM | POA: Diagnosis not present

## 2017-09-19 DIAGNOSIS — K8020 Calculus of gallbladder without cholecystitis without obstruction: Secondary | ICD-10-CM

## 2017-09-19 DIAGNOSIS — Z79899 Other long term (current) drug therapy: Secondary | ICD-10-CM | POA: Diagnosis not present

## 2017-09-19 DIAGNOSIS — J45909 Unspecified asthma, uncomplicated: Secondary | ICD-10-CM | POA: Diagnosis not present

## 2017-09-19 DIAGNOSIS — Z85828 Personal history of other malignant neoplasm of skin: Secondary | ICD-10-CM | POA: Diagnosis not present

## 2017-09-19 DIAGNOSIS — K219 Gastro-esophageal reflux disease without esophagitis: Secondary | ICD-10-CM | POA: Diagnosis not present

## 2017-09-19 DIAGNOSIS — E039 Hypothyroidism, unspecified: Secondary | ICD-10-CM | POA: Diagnosis not present

## 2017-09-19 HISTORY — PX: CHOLECYSTECTOMY: SHX55

## 2017-09-19 SURGERY — LAPAROSCOPIC CHOLECYSTECTOMY WITH INTRAOPERATIVE CHOLANGIOGRAM
Anesthesia: General | Site: Abdomen

## 2017-09-19 MED ORDER — SUGAMMADEX SODIUM 200 MG/2ML IV SOLN
INTRAVENOUS | Status: DC | PRN
  Administered 2017-09-19: 150 mg via INTRAVENOUS

## 2017-09-19 MED ORDER — SCOPOLAMINE 1 MG/3DAYS TD PT72
MEDICATED_PATCH | TRANSDERMAL | Status: DC | PRN
  Administered 2017-09-19: 1 via TRANSDERMAL

## 2017-09-19 MED ORDER — FENTANYL CITRATE (PF) 250 MCG/5ML IJ SOLN
INTRAMUSCULAR | Status: AC
  Filled 2017-09-19: qty 5

## 2017-09-19 MED ORDER — SODIUM CHLORIDE 0.9% FLUSH: 3.0000 mL | INTRAVENOUS | Status: DC | PRN

## 2017-09-19 MED ORDER — KETOROLAC TROMETHAMINE 30 MG/ML IJ SOLN
INTRAMUSCULAR | Status: DC | PRN
  Administered 2017-09-19: 30 mg via INTRAVENOUS

## 2017-09-19 MED ORDER — GABAPENTIN 300 MG PO CAPS
300.0000 mg | ORAL_CAPSULE | ORAL | Status: AC
  Administered 2017-09-19: 300 mg via ORAL
  Filled 2017-09-19: qty 1

## 2017-09-19 MED ORDER — STERILE WATER FOR IRRIGATION IR SOLN
Status: DC | PRN
  Administered 2017-09-19: 1000 mL

## 2017-09-19 MED ORDER — SODIUM CHLORIDE 0.9% FLUSH: 3.0000 mL | Freq: Two times a day (BID) | INTRAVENOUS | Status: DC

## 2017-09-19 MED ORDER — ACETAMINOPHEN 10 MG/ML IV SOLN: 1000.0000 mg | Freq: Once | INTRAVENOUS | Status: DC | PRN

## 2017-09-19 MED ORDER — FENTANYL CITRATE (PF) 100 MCG/2ML IJ SOLN
INTRAMUSCULAR | Status: DC | PRN
  Administered 2017-09-19 (×2): 50 ug via INTRAVENOUS
  Administered 2017-09-19: 100 ug via INTRAVENOUS
  Administered 2017-09-19: 50 ug via INTRAVENOUS

## 2017-09-19 MED ORDER — ACETAMINOPHEN 500 MG PO TABS
1000.0000 mg | ORAL_TABLET | ORAL | Status: AC
  Administered 2017-09-19: 1000 mg via ORAL
  Filled 2017-09-19: qty 2

## 2017-09-19 MED ORDER — OXYCODONE HCL 5 MG PO TABS: 5.0000 mg | ORAL_TABLET | Freq: Four times a day (QID) | ORAL | 0 refills | Status: DC | PRN

## 2017-09-19 MED ORDER — PROPOFOL 10 MG/ML IV BOLUS
INTRAVENOUS | Status: DC | PRN
  Administered 2017-09-19 (×2): 160 mg via INTRAVENOUS

## 2017-09-19 MED ORDER — MEPERIDINE HCL 25 MG/ML IJ SOLN: 6.2500 mg | INTRAMUSCULAR | Status: DC | PRN

## 2017-09-19 MED ORDER — IOPAMIDOL (ISOVUE-300) INJECTION 61%
INTRAVENOUS | Status: DC | PRN
  Administered 2017-09-19: 40 mL

## 2017-09-19 MED ORDER — ROCURONIUM BROMIDE 100 MG/10ML IV SOLN
INTRAVENOUS | Status: DC | PRN
  Administered 2017-09-19: 5 mg via INTRAVENOUS
  Administered 2017-09-19: 10 mg via INTRAVENOUS
  Administered 2017-09-19: 40 mg via INTRAVENOUS

## 2017-09-19 MED ORDER — LIDOCAINE HCL (CARDIAC) 20 MG/ML IV SOLN
INTRAVENOUS | Status: DC | PRN
  Administered 2017-09-19: 100 mg via INTRAVENOUS

## 2017-09-19 MED ORDER — PROPOFOL 10 MG/ML IV BOLUS
INTRAVENOUS | Status: AC
  Filled 2017-09-19: qty 20

## 2017-09-19 MED ORDER — BUPIVACAINE-EPINEPHRINE (PF) 0.5% -1:200000 IJ SOLN
INTRAMUSCULAR | Status: DC | PRN
  Administered 2017-09-19: 23 mL

## 2017-09-19 MED ORDER — HYDROCODONE-ACETAMINOPHEN 7.5-325 MG PO TABS: 1.0000 | ORAL_TABLET | Freq: Once | ORAL | Status: DC | PRN

## 2017-09-19 MED ORDER — BUPIVACAINE-EPINEPHRINE (PF) 0.5% -1:200000 IJ SOLN
INTRAMUSCULAR | Status: AC
  Filled 2017-09-19: qty 30

## 2017-09-19 MED ORDER — SODIUM CHLORIDE 0.9 % IR SOLN
Status: DC | PRN
  Administered 2017-09-19: 1000 mL

## 2017-09-19 MED ORDER — OXYCODONE HCL 5 MG PO TABS: 5.0000 mg | ORAL_TABLET | ORAL | Status: DC | PRN

## 2017-09-19 MED ORDER — CEFOTETAN DISODIUM-DEXTROSE 2-2.08 GM-%(50ML) IV SOLR
2.0000 g | INTRAVENOUS | Status: AC
  Administered 2017-09-19: 2 g via INTRAVENOUS
  Filled 2017-09-19: qty 50

## 2017-09-19 MED ORDER — DEXAMETHASONE SODIUM PHOSPHATE 4 MG/ML IJ SOLN
INTRAMUSCULAR | Status: DC | PRN
  Administered 2017-09-19: 5 mg via INTRAVENOUS

## 2017-09-19 MED ORDER — FENTANYL CITRATE (PF) 100 MCG/2ML IJ SOLN: 25.0000 ug | INTRAMUSCULAR | Status: DC | PRN

## 2017-09-19 MED ORDER — ACETAMINOPHEN 650 MG RE SUPP: 650.0000 mg | RECTAL | Status: DC | PRN

## 2017-09-19 MED ORDER — ONDANSETRON HCL 4 MG/2ML IJ SOLN
INTRAMUSCULAR | Status: DC | PRN
  Administered 2017-09-19: 4 mg via INTRAVENOUS

## 2017-09-19 MED ORDER — PROMETHAZINE HCL 25 MG/ML IJ SOLN: 6.2500 mg | INTRAMUSCULAR | Status: DC | PRN

## 2017-09-19 MED ORDER — ACETAMINOPHEN 325 MG PO TABS: 650.0000 mg | ORAL_TABLET | ORAL | Status: DC | PRN

## 2017-09-19 MED ORDER — LACTATED RINGERS IV SOLN
INTRAVENOUS | Status: DC | PRN
  Administered 2017-09-19 (×2): via INTRAVENOUS

## 2017-09-19 MED ORDER — IOPAMIDOL (ISOVUE-300) INJECTION 61%
INTRAVENOUS | Status: AC
  Filled 2017-09-19: qty 50

## 2017-09-19 MED ORDER — 0.9 % SODIUM CHLORIDE (POUR BTL) OPTIME
TOPICAL | Status: DC | PRN
  Administered 2017-09-19: 1000 mL

## 2017-09-19 MED ORDER — MIDAZOLAM HCL 5 MG/5ML IJ SOLN
INTRAMUSCULAR | Status: DC | PRN
  Administered 2017-09-19: 2 mg via INTRAVENOUS

## 2017-09-19 MED ORDER — HYDROMORPHONE HCL 1 MG/ML IJ SOLN: 0.2500 mg | INTRAMUSCULAR | Status: DC | PRN

## 2017-09-19 MED ORDER — SODIUM CHLORIDE 0.9 % IV SOLN: 250.0000 mL | INTRAVENOUS | Status: DC | PRN

## 2017-09-19 MED ORDER — MIDAZOLAM HCL 2 MG/2ML IJ SOLN
INTRAMUSCULAR | Status: AC
  Filled 2017-09-19: qty 2

## 2017-09-19 SURGICAL SUPPLY — 45 items
APPLIER CLIP 5 13 M/L LIGAMAX5 (MISCELLANEOUS) ×3
BENZOIN TINCTURE PRP APPL 2/3 (GAUZE/BANDAGES/DRESSINGS) IMPLANT
CANISTER SUCT 3000ML PPV (MISCELLANEOUS) ×3 IMPLANT
CHLORAPREP W/TINT 26ML (MISCELLANEOUS) ×3 IMPLANT
CLIP APPLIE 5 13 M/L LIGAMAX5 (MISCELLANEOUS) ×1 IMPLANT
CLOSURE WOUND 1/2 X4 (GAUZE/BANDAGES/DRESSINGS)
COVER MAYO STAND STRL (DRAPES) ×3 IMPLANT
COVER SURGICAL LIGHT HANDLE (MISCELLANEOUS) ×3 IMPLANT
DERMABOND ADVANCED (GAUZE/BANDAGES/DRESSINGS) ×2
DERMABOND ADVANCED .7 DNX12 (GAUZE/BANDAGES/DRESSINGS) ×1 IMPLANT
DRAPE C-ARM 42X72 X-RAY (DRAPES) ×3 IMPLANT
ELECT REM PT RETURN 9FT ADLT (ELECTROSURGICAL) ×3
ELECTRODE REM PT RTRN 9FT ADLT (ELECTROSURGICAL) ×1 IMPLANT
ENDOLOOP SUT PDS II  0 18 (SUTURE) ×2
ENDOLOOP SUT PDS II 0 18 (SUTURE) ×1 IMPLANT
GLOVE BIOGEL M STRL SZ7.5 (GLOVE) ×3 IMPLANT
GLOVE BIOGEL PI IND STRL 6.5 (GLOVE) ×2 IMPLANT
GLOVE BIOGEL PI IND STRL 8 (GLOVE) ×1 IMPLANT
GLOVE BIOGEL PI INDICATOR 6.5 (GLOVE) ×4
GLOVE BIOGEL PI INDICATOR 8 (GLOVE) ×2
GLOVE SURG SS PI 6.5 STRL IVOR (GLOVE) ×9 IMPLANT
GOWN STRL REUS W/ TWL LRG LVL3 (GOWN DISPOSABLE) ×2 IMPLANT
GOWN STRL REUS W/TWL 2XL LVL3 (GOWN DISPOSABLE) ×3 IMPLANT
GOWN STRL REUS W/TWL LRG LVL3 (GOWN DISPOSABLE) ×4
GRASPER SUT TROCAR 14GX15 (MISCELLANEOUS) ×3 IMPLANT
KIT BASIN OR (CUSTOM PROCEDURE TRAY) ×3 IMPLANT
KIT ROOM TURNOVER OR (KITS) ×3 IMPLANT
NS IRRIG 1000ML POUR BTL (IV SOLUTION) ×3 IMPLANT
PAD ARMBOARD 7.5X6 YLW CONV (MISCELLANEOUS) ×3 IMPLANT
POUCH RETRIEVAL ECOSAC 10 (ENDOMECHANICALS) ×1 IMPLANT
POUCH RETRIEVAL ECOSAC 10MM (ENDOMECHANICALS) ×2
SCISSORS LAP 5X35 DISP (ENDOMECHANICALS) ×3 IMPLANT
SET CHOLANGIOGRAPH 5 50 .035 (SET/KITS/TRAYS/PACK) ×6 IMPLANT
SET IRRIG TUBING LAPAROSCOPIC (IRRIGATION / IRRIGATOR) ×3 IMPLANT
SLEEVE ENDOPATH XCEL 5M (ENDOMECHANICALS) ×6 IMPLANT
SPECIMEN JAR SMALL (MISCELLANEOUS) ×3 IMPLANT
STRIP CLOSURE SKIN 1/2X4 (GAUZE/BANDAGES/DRESSINGS) IMPLANT
SUT MNCRL AB 4-0 PS2 18 (SUTURE) ×6 IMPLANT
SUT VICRYL 0 UR6 27IN ABS (SUTURE) ×3 IMPLANT
SYR 20CC LL (SYRINGE) ×3 IMPLANT
TRAY LAPAROSCOPIC MC (CUSTOM PROCEDURE TRAY) ×3 IMPLANT
TROCAR XCEL BLUNT TIP 100MML (ENDOMECHANICALS) ×3 IMPLANT
TROCAR XCEL NON-BLD 5MMX100MML (ENDOMECHANICALS) ×3 IMPLANT
TUBING INSUFFLATION (TUBING) ×3 IMPLANT
WATER STERILE IRR 1000ML POUR (IV SOLUTION) ×3 IMPLANT

## 2017-09-19 NOTE — Addendum Note (Signed)
Addendum  created 09/19/17 1038 by Oletta Lamas, CRNA   Intraprocedure Flowsheets edited

## 2017-09-19 NOTE — Anesthesia Procedure Notes (Addendum)
Procedure Name: Intubation Date/Time: 09/19/2017 7:38 AM Performed by: Oletta Lamas, CRNA Pre-anesthesia Checklist: Patient identified, Emergency Drugs available, Suction available and Patient being monitored Patient Re-evaluated:Patient Re-evaluated prior to induction Oxygen Delivery Method: Circle System Utilized Preoxygenation: Pre-oxygenation with 100% oxygen Induction Type: IV induction Ventilation: Mask ventilation without difficulty Laryngoscope Size: Mac and 3 Grade View: Grade I Tube type: Oral Tube size: 7.0 mm Number of attempts: 1 Airway Equipment and Method: Stylet Placement Confirmation: ETT inserted through vocal cords under direct vision,  positive ETCO2 and breath sounds checked- equal and bilateral Secured at: 21 cm Tube secured with: Tape Dental Injury: Teeth and Oropharynx as per pre-operative assessment

## 2017-09-19 NOTE — Discharge Instructions (Signed)
[facility] LAPAROSCOPIC [facility]: POST OP INSTRUCTIONS Always review your discharge instruction sheet given to you by the facility where your surgery was performed. IF YOU HAVE DISABILITY OR FAMILY LEAVE FORMS, YOU MUST BRING THEM TO THE OFFICE FOR PROCESSING.   DO NOT GIVE THEM TO YOUR DOCTOR.  1. A prescription for pain medication was sent to your pharmacy on discharge. First take, acetaminophen (Tylenol) &/or ibuprofen (Advil) as needed. You may alternate these medications.  Take your pain medication as prescribed, if needed.  2. Take your usually prescribed medications unless otherwise directed. 3. If you need a refill on your pain medication, please contact your pharmacy.  They will contact our office to request authorization. Prescriptions will not be filled after 5pm or on week-ends. 4. You should follow a light diet the first few days after arrival home, such as soup and crackers, etc.  Be sure to include lots of fluids daily. 5. Most patients will experience some swelling and bruising in the area of the incisions.  Ice packs will help.  Swelling and bruising can take several days to resolve.  6. It is common to experience some constipation if taking pain medication after surgery.  Increasing fluid intake and taking a stool softener (such as Colace) will usually help or prevent this problem from occurring.  A mild laxative (Milk of Magnesia or Miralax) should be taken according to package instructions if there are no bowel movements after 48 hours. 7. Unless discharge instructions indicate otherwise, you may remove your bandages 24-48 hours after surgery, and you may shower at that time.  You may have steri-strips (small skin tapes) in place directly over the incision.  These strips should be left on the skin for 7-10 days.  If your surgeon used skin glue on the incision, you may shower in 24 hours.  The glue will flake off over the next 2-3 weeks.  Any sutures or staples  will be removed at the office during your follow-up visit. 8. ACTIVITIES:  You may resume regular (light) daily activities beginning the next day--such as daily self-care, walking, climbing stairs--gradually increasing activities as tolerated.  You may have sexual intercourse when it is comfortable.  Refrain from any heavy lifting or straining until approved by your doctor. a. You may drive when you are no longer taking prescription pain medication, you can comfortably wear a seatbelt, and you can safely maneuver your car and apply brakes. 9. You should see your doctor in the office for a follow-up appointment approximately 2-3 weeks after your surgery.  Make sure that you call for this appointment within a day or two after you arrive home to insure a convenient appointment time. 10. OTHER INSTRUCTIONS:  WHEN TO CALL YOUR DOCTOR: 1. Fever over 101.0 2. Inability to urinate 3. Continued bleeding from incision. 4. Increased pain, redness, or drainage from the incision. 5. Increasing abdominal pain  The clinic staff is available to answer your questions during regular business hours.  Please dont hesitate to call and ask to speak to one of the nurses for clinical concerns.  If you have a medical emergency, go to the nearest emergency room or call 911.  A surgeon from [facility] is always on call at the hospital. [address], [place], [place], [place]  [address] ? [address], [place], [place]    [address] [phone] ? [phone] ? FAX [phone] Web site: [email or web address]

## 2017-09-19 NOTE — H&P (Signed)
Maria Cobb is an 52 y.o. female.   Chief Complaint: here for surgery  HPI: here for surgery. Denies medical changes since seen in clinic.   The patient is a 52 year old female who presents for evaluation of gall stones. She is referred by Dr. Fuller Plan for evaluation of gallstone disease. She reports that for several years she's been having intermittent episodes of epigastric pain. They occur a few times a year but are generally quite painful. May last anywhere from 4 hours to 24 hours. Associated with nausea. Dry heaves and sweating. She also will have a sensation of a gas bubble in the upper abdomen. She was initially diagnosed with GERD and was placed on Nexium but she continued to have intermittent episodes. She most recently saw gastroenterology who ordered an abdominal ultrasound and she was found to have a gallstone up to 2.8 cm as well as a mildly dilated common bile duct. Her labs were normal. She denies any melena or hematochezia. She denies any acholic stools. She denies any frequent NSAID use. She travels quite a lot for work. She denies any chest pain, chest pressure, shortness of breath, dyspnea on exertion.  Past Medical History:  Diagnosis Date  . Asthma    mild  . Diverticulosis   . GERD (gastroesophageal reflux disease)   . Heart murmur   . History of bilateral breast implants   . Hypothyroidism   . Lumbar spine strain   . Menorrhagia   . Postmenopausal HRT (hormone replacement therapy)   . SCC (squamous cell carcinoma)     Past Surgical History:  Procedure Laterality Date  . BREAST SURGERY  1990   implants    Family History  Problem Relation Age of Onset  . Heart disease Mother   . High Cholesterol Mother   . Stroke Maternal Grandfather   . Prostate cancer Father   . Colon cancer Neg Hx   . Esophageal cancer Neg Hx   . Rectal cancer Neg Hx    Social History:  reports that  has never smoked. she has never used smokeless tobacco. She reports  that she drinks alcohol. She reports that she does not use drugs.  Allergies:  Allergies  Allergen Reactions  . Tape Rash    Medications Prior to Admission  Medication Sig Dispense Refill  . fluocinonide ointment (LIDEX) 4.09 % Apply 1 application topically 2 (two) times daily. (Patient taking differently: Apply 1 application topically 2 (two) times daily as needed (for rash). ) 30 g 0  . levothyroxine (SYNTHROID, LEVOTHROID) 25 MCG tablet Take 25 mcg by mouth at bedtime.    . minocycline (MINOCIN,DYNACIN) 100 MG capsule Take 100 mg by mouth daily.    . Multiple Vitamins-Minerals (MULTIVITAMIN ADULTS) TABS Take 1 tablet by mouth daily.    . norethindrone-ethinyl estradiol (FEMHRT 1/5) 1-5 MG-MCG TABS tablet Take 2 tablets by mouth daily.    . pantoprazole (PROTONIX) 40 MG tablet Take 1 tablet (40 mg total) by mouth daily. 90 tablet 1  . Alum & Mag Hydroxide-Simeth (GI COCKTAIL) SUSP suspension Use 90 mL 2 % viscous lidocaine, 90 mL dicyclomine 10mg /27mL, 270 mL Maalox 400mg / 5-10 mL every 4 hours as needed (Patient not taking: Reported on 09/08/2017) 450 mL 0  . HYDROcodone-acetaminophen (NORCO/VICODIN) 5-325 MG tablet Take 1 tablet by mouth every 6 (six) hours as needed for moderate pain. (Patient not taking: Reported on 09/08/2017) 30 tablet 0  . ibuprofen (ADVIL,MOTRIN) 200 MG tablet Take 400 mg by mouth every 6 (  six) hours as needed for headache or moderate pain.      No results found for this or any previous visit (from the past 48 hour(s)). No results found.  Review of Systems  Constitutional: Negative for weight loss.  HENT: Negative for nosebleeds.   Eyes: Negative for blurred vision.  Respiratory: Negative for shortness of breath.   Cardiovascular: Negative for chest pain, palpitations, orthopnea and PND.       Denies DOE  Genitourinary: Negative for dysuria and hematuria.  Musculoskeletal: Negative.   Skin: Negative for itching and rash.  Neurological: Negative for dizziness,  focal weakness, seizures, loss of consciousness and headaches.       Denies TIAs, amaurosis fugax  Endo/Heme/Allergies: Does not bruise/bleed easily.  Psychiatric/Behavioral: The patient is not nervous/anxious.     Blood pressure (!) 141/75, pulse 75, temperature 97.8 F (36.6 C), temperature source Oral, resp. rate 18, last menstrual period 05/06/2017, SpO2 100 %. Physical Exam  Vitals reviewed. Constitutional: She is oriented to person, place, and time. She appears well-developed and well-nourished. No distress.  HENT:  Head: Normocephalic and atraumatic.  Right Ear: External ear normal.  Left Ear: External ear normal.  Eyes: Conjunctivae are normal. No scleral icterus.  Neck: Normal range of motion. Neck supple. No tracheal deviation present. No thyromegaly present.  Cardiovascular: Normal rate and normal heart sounds.  Respiratory: Effort normal and breath sounds normal. No stridor. No respiratory distress. She has no wheezes.  GI: Soft. She exhibits no distension. There is no tenderness. There is no rebound and no guarding.  Musculoskeletal: She exhibits no edema or tenderness.  Lymphadenopathy:    She has no cervical adenopathy.  Neurological: She is alert and oriented to person, place, and time. She exhibits normal muscle tone.  Skin: Skin is warm and dry. No rash noted. She is not diaphoretic. No erythema. No pallor.  Psychiatric: She has a normal mood and affect. Her behavior is normal. Judgment and thought content normal.     Assessment/Plan Symptomatic cholelithiasis  alll questions asked and answered ERAS meds given  Leighton Ruff. Redmond Pulling, MD, FACS General, Bariatric, & Minimally Invasive Surgery [facility], [place]  Greer Pickerel, MD 09/19/2017, 7:22 AM

## 2017-09-19 NOTE — Op Note (Signed)
Maria Cobb [id number] [birth year] 09/19/2017  Laparoscopic Cholecystectomy with IOC Procedure Note  Indications: This patient presents with symptomatic gallbladder disease and will undergo laparoscopic cholecystectomy.  Pre-operative Diagnosis: symptomatic cholelithiasis  Post-operative Diagnosis: chronic calculous cholecystitis  Surgeon: Greer Pickerel   Assistants: none  Anesthesia: General endotracheal anesthesia  Procedure Details  The patient was seen again in the Holding Room. The risks, benefits, complications, treatment options, and expected outcomes were discussed with the patient. The possibilities of reaction to medication, pulmonary aspiration, perforation of viscus, bleeding, recurrent infection, finding a normal gallbladder, the need for additional procedures, failure to diagnose a condition, the possible need to convert to an open procedure, and creating a complication requiring transfusion or operation were discussed with the patient. The likelihood of improving the patient's symptoms with return to their baseline status is good.  The patient and/or family concurred with the proposed plan, giving informed consent. The site of surgery properly noted. The patient was taken to Operating Room, identified as Maria Cobb and the procedure verified as Laparoscopic Cholecystectomy with Intraoperative Cholangiogram. A Time Out was held and the above information confirmed. Antibiotic prophylaxis was administered. She received ERAS medications preoperatively.   Prior to the induction of general anesthesia, antibiotic prophylaxis was administered. General endotracheal anesthesia was then administered and tolerated well. After the induction, the abdomen was prepped with Chloraprep and draped in the sterile fashion. The patient was positioned in the supine position.  Local anesthetic agent was injected into the skin near the umbilicus and an incision made. We dissected down to  the abdominal fascia with blunt dissection.  The fascia was incised vertically and we entered the peritoneal cavity bluntly.  A pursestring suture of 0-Vicryl was placed around the fascial opening.  The Hasson cannula was inserted and secured with the stay suture.  Pneumoperitoneum was then created with CO2 and tolerated well without any adverse changes in the patient's vital signs. An 5-mm port was placed in the subxiphoid position.  Two 5-mm ports were placed in the right upper quadrant. All skin incisions were infiltrated with a local anesthetic agent before making the incision and placing the trocars.   We positioned the patient in reverse Trendelenburg, tilted slightly to the patient's left.  The gallbladder was identified, the fundus grasped and retracted cephalad. She had a large gallstone. Adhesions were lysed bluntly and with the electrocautery where indicated, taking care not to injure any adjacent organs or viscus. The infundibulum was grasped and retracted laterally, exposing the peritoneum overlying the triangle of Calot. This was then divided and exposed in a blunt fashion. A critical view of the cystic duct and cystic artery was obtained. She had a dilated cystic duct.  The cystic duct was clearly identified and bluntly dissected circumferentially.  I decided to go ahead and take the cystic artery.  There was both an anterior and posterior branch.  Each was ligated with clips and then transected between the clips.  Given the dilation of the cystic duct I elected to place the cholangiogram catheter into the body of the gallbladder.  A cholangiogram was then obtained which showed good visualization of the distal and proximal biliary tree with no sign of filling defects or obstruction.  Contrast flowed easily into the duodenum. The catheter was then removed.   The cystic duct was then ligated with clip and divided.  Given the dilation of the cystic duct, I placed a PDS Endoloop around the cystic duct  stump.    The gallbladder was dissected  from the liver bed in retrograde fashion with the electrocautery. The gallbladder was removed and placed in an Ecco sac.  The gallbladder and Ecco sac were then removed through the umbilical port site after enlarging the fascial defect.. The liver bed was irrigated and inspected. Hemostasis was achieved with the electrocautery. Copious irrigation was utilized and was repeatedly aspirated until clear.  The pursestring suture was used to close the umbilical fascia.    We again inspected the right upper quadrant for hemostasis.  The umbilical closure was inspected and there was no air leak and nothing trapped within the closure.  However I did place an additional interrupted fascial suture using 0 Vicryl with the PMI suture passer.  Pneumoperitoneum was released as we removed the trocars.  4-0 Monocryl was used to close the skin.   Dermabond was applied. The patient was then extubated and brought to the recovery room in stable condition. Instrument, sponge, and needle counts were correct at closure and at the conclusion of the case.   Findings: Chronic cholecystitis with Cholelithiasis  Estimated Blood Loss: less than 50 mL         Drains: none         Specimens: Gallbladder           Complications: None; patient tolerated the procedure well.         Disposition: PACU - hemodynamically stable.         Condition: stable  Leighton Ruff. Redmond Pulling, MD, FACS General, Bariatric, & Minimally Invasive Surgery [facility], [place]

## 2017-09-19 NOTE — Transfer of Care (Signed)
Immediate Anesthesia Transfer of Care Note  Patient: Maria Cobb  Procedure(s) Performed: LAPAROSCOPIC CHOLECYSTECTOMY  WITH  INTRAOPERATIVE CHOLANGIOGRAM ERAS PATHWAY (N/A Abdomen)  Patient Location: PACU  Anesthesia Type:General  Level of Consciousness: awake, oriented, drowsy and patient cooperative  Airway & Oxygen Therapy: Patient Spontanous Breathing  Post-op Assessment: Report given to RN and Post -op Vital signs reviewed and stable  Post vital signs: Reviewed and stable  Last Vitals:  Vitals:   09/19/17 0545 09/19/17 0929  BP: (!) 141/75 128/71  Pulse: 75 98  Resp: 18 18  Temp: 36.6 C 36.4 C  SpO2: 100% 99%    Last Pain:  Vitals:   09/19/17 0929  TempSrc:   PainSc: (P) Asleep      Patients Stated Pain Goal: 3 (81/44/81 8563)  Complications: No apparent anesthesia complications

## 2017-09-19 NOTE — Progress Notes (Signed)
In pre-admission cbc,bmet drawn. Pre-op nurse didn't see order for cbc with diff, cmet.Notified Dr. Renelda Loma not to repeat labs.

## 2017-09-19 NOTE — Anesthesia Postprocedure Evaluation (Signed)
Anesthesia Post Note  Patient: Maria Cobb  Procedure(s) Performed: LAPAROSCOPIC CHOLECYSTECTOMY  WITH  INTRAOPERATIVE CHOLANGIOGRAM ERAS PATHWAY (N/A Abdomen)     Patient location during evaluation: PACU Anesthesia Type: General Level of consciousness: awake and alert Pain management: pain level controlled Vital Signs Assessment: post-procedure vital signs reviewed and stable Respiratory status: spontaneous breathing, nonlabored ventilation and respiratory function stable Cardiovascular status: blood pressure returned to baseline and stable Postop Assessment: no apparent nausea or vomiting Anesthetic complications: no    Last Vitals:  Vitals:   09/19/17 0959 09/19/17 1009  BP: 133/75 135/80  Pulse: 71 70  Resp: 12   Temp: 36.6 C   SpO2: 99% 96%    Last Pain:  Vitals:   09/19/17 1009  TempSrc:   PainSc: 0-No pain                 Barnet Glasgow

## 2017-09-20 ENCOUNTER — Encounter (HOSPITAL_COMMUNITY): Payer: Self-pay | Admitting: General Surgery

## 2017-12-11 DIAGNOSIS — M9903 Segmental and somatic dysfunction of lumbar region: Secondary | ICD-10-CM | POA: Diagnosis not present

## 2017-12-11 DIAGNOSIS — M9905 Segmental and somatic dysfunction of pelvic region: Secondary | ICD-10-CM | POA: Diagnosis not present

## 2017-12-11 DIAGNOSIS — M9904 Segmental and somatic dysfunction of sacral region: Secondary | ICD-10-CM | POA: Diagnosis not present

## 2017-12-13 ENCOUNTER — Other Ambulatory Visit: Payer: Self-pay | Admitting: Physician Assistant

## 2017-12-18 DIAGNOSIS — M9905 Segmental and somatic dysfunction of pelvic region: Secondary | ICD-10-CM | POA: Diagnosis not present

## 2017-12-18 DIAGNOSIS — M9903 Segmental and somatic dysfunction of lumbar region: Secondary | ICD-10-CM | POA: Diagnosis not present

## 2017-12-18 DIAGNOSIS — M9904 Segmental and somatic dysfunction of sacral region: Secondary | ICD-10-CM | POA: Diagnosis not present

## 2017-12-21 DIAGNOSIS — M9904 Segmental and somatic dysfunction of sacral region: Secondary | ICD-10-CM | POA: Diagnosis not present

## 2017-12-21 DIAGNOSIS — M9905 Segmental and somatic dysfunction of pelvic region: Secondary | ICD-10-CM | POA: Diagnosis not present

## 2017-12-21 DIAGNOSIS — M9903 Segmental and somatic dysfunction of lumbar region: Secondary | ICD-10-CM | POA: Diagnosis not present

## 2017-12-25 DIAGNOSIS — M9904 Segmental and somatic dysfunction of sacral region: Secondary | ICD-10-CM | POA: Diagnosis not present

## 2017-12-25 DIAGNOSIS — M9905 Segmental and somatic dysfunction of pelvic region: Secondary | ICD-10-CM | POA: Diagnosis not present

## 2017-12-25 DIAGNOSIS — M9903 Segmental and somatic dysfunction of lumbar region: Secondary | ICD-10-CM | POA: Diagnosis not present

## 2017-12-26 DIAGNOSIS — M9904 Segmental and somatic dysfunction of sacral region: Secondary | ICD-10-CM | POA: Diagnosis not present

## 2017-12-26 DIAGNOSIS — M9905 Segmental and somatic dysfunction of pelvic region: Secondary | ICD-10-CM | POA: Diagnosis not present

## 2017-12-26 DIAGNOSIS — M9903 Segmental and somatic dysfunction of lumbar region: Secondary | ICD-10-CM | POA: Diagnosis not present

## 2018-01-08 DIAGNOSIS — M9905 Segmental and somatic dysfunction of pelvic region: Secondary | ICD-10-CM | POA: Diagnosis not present

## 2018-01-08 DIAGNOSIS — M9903 Segmental and somatic dysfunction of lumbar region: Secondary | ICD-10-CM | POA: Diagnosis not present

## 2018-01-08 DIAGNOSIS — M9904 Segmental and somatic dysfunction of sacral region: Secondary | ICD-10-CM | POA: Diagnosis not present

## 2018-01-09 DIAGNOSIS — M9904 Segmental and somatic dysfunction of sacral region: Secondary | ICD-10-CM | POA: Diagnosis not present

## 2018-01-09 DIAGNOSIS — M9905 Segmental and somatic dysfunction of pelvic region: Secondary | ICD-10-CM | POA: Diagnosis not present

## 2018-01-09 DIAGNOSIS — M9903 Segmental and somatic dysfunction of lumbar region: Secondary | ICD-10-CM | POA: Diagnosis not present

## 2018-01-15 DIAGNOSIS — M9904 Segmental and somatic dysfunction of sacral region: Secondary | ICD-10-CM | POA: Diagnosis not present

## 2018-01-15 DIAGNOSIS — M9905 Segmental and somatic dysfunction of pelvic region: Secondary | ICD-10-CM | POA: Diagnosis not present

## 2018-01-15 DIAGNOSIS — M9903 Segmental and somatic dysfunction of lumbar region: Secondary | ICD-10-CM | POA: Diagnosis not present

## 2018-01-23 DIAGNOSIS — M9904 Segmental and somatic dysfunction of sacral region: Secondary | ICD-10-CM | POA: Diagnosis not present

## 2018-01-23 DIAGNOSIS — Z01419 Encounter for gynecological examination (general) (routine) without abnormal findings: Secondary | ICD-10-CM | POA: Diagnosis not present

## 2018-01-23 DIAGNOSIS — Z1231 Encounter for screening mammogram for malignant neoplasm of breast: Secondary | ICD-10-CM | POA: Diagnosis not present

## 2018-01-23 DIAGNOSIS — M9903 Segmental and somatic dysfunction of lumbar region: Secondary | ICD-10-CM | POA: Diagnosis not present

## 2018-01-23 DIAGNOSIS — Z124 Encounter for screening for malignant neoplasm of cervix: Secondary | ICD-10-CM | POA: Diagnosis not present

## 2018-01-23 DIAGNOSIS — M9905 Segmental and somatic dysfunction of pelvic region: Secondary | ICD-10-CM | POA: Diagnosis not present

## 2018-01-23 LAB — HM PAP SMEAR

## 2018-01-23 LAB — HM MAMMOGRAPHY

## 2018-01-30 DIAGNOSIS — M9905 Segmental and somatic dysfunction of pelvic region: Secondary | ICD-10-CM | POA: Diagnosis not present

## 2018-01-30 DIAGNOSIS — M9903 Segmental and somatic dysfunction of lumbar region: Secondary | ICD-10-CM | POA: Diagnosis not present

## 2018-01-30 DIAGNOSIS — M9904 Segmental and somatic dysfunction of sacral region: Secondary | ICD-10-CM | POA: Diagnosis not present

## 2018-02-05 ENCOUNTER — Ambulatory Visit (INDEPENDENT_AMBULATORY_CARE_PROVIDER_SITE_OTHER): Payer: 59 | Admitting: Family Medicine

## 2018-02-05 ENCOUNTER — Encounter: Payer: Self-pay | Admitting: Family Medicine

## 2018-02-05 VITALS — BP 108/68 | HR 75 | Temp 97.7°F | Ht 69.0 in | Wt 161.8 lb

## 2018-02-05 DIAGNOSIS — M7062 Trochanteric bursitis, left hip: Secondary | ICD-10-CM

## 2018-02-05 DIAGNOSIS — E039 Hypothyroidism, unspecified: Secondary | ICD-10-CM | POA: Diagnosis not present

## 2018-02-05 DIAGNOSIS — Z0000 Encounter for general adult medical examination without abnormal findings: Secondary | ICD-10-CM | POA: Diagnosis not present

## 2018-02-05 DIAGNOSIS — Z1159 Encounter for screening for other viral diseases: Secondary | ICD-10-CM

## 2018-02-05 DIAGNOSIS — E7800 Pure hypercholesterolemia, unspecified: Secondary | ICD-10-CM

## 2018-02-05 DIAGNOSIS — Z114 Encounter for screening for human immunodeficiency virus [HIV]: Secondary | ICD-10-CM

## 2018-02-05 DIAGNOSIS — Z5320 Procedure and treatment not carried out because of patient's decision for unspecified reasons: Secondary | ICD-10-CM

## 2018-02-05 DIAGNOSIS — M7061 Trochanteric bursitis, right hip: Secondary | ICD-10-CM

## 2018-02-05 DIAGNOSIS — R002 Palpitations: Secondary | ICD-10-CM | POA: Diagnosis not present

## 2018-02-05 DIAGNOSIS — E785 Hyperlipidemia, unspecified: Secondary | ICD-10-CM | POA: Insufficient documentation

## 2018-02-05 LAB — LIPID PANEL
Cholesterol: 194 mg/dL (ref 0–200)
HDL: 60.5 mg/dL (ref >39.00)
LDL Cholesterol: 116 mg/dL — ABNORMAL HIGH (ref 0–99)
NonHDL: 133.31
Total CHOL/HDL Ratio: 3
Triglycerides: 85 mg/dL (ref 0.0–149.0)
VLDL: 17 mg/dL (ref 0.0–40.0)

## 2018-02-05 LAB — CBC WITH DIFFERENTIAL/PLATELET
Basophils Absolute: 0 10*3/uL (ref 0.0–0.1)
Basophils Relative: 0.6 % (ref 0.0–3.0)
Eosinophils Absolute: 0.1 10*3/uL (ref 0.0–0.7)
Eosinophils Relative: 1.3 % (ref 0.0–5.0)
HCT: 44.7 % (ref 36.0–46.0)
Hemoglobin: 14.9 g/dL (ref 12.0–15.0)
Lymphocytes Relative: 31.1 % (ref 12.0–46.0)
Lymphs Abs: 2.1 10*3/uL (ref 0.7–4.0)
MCHC: 33.4 g/dL (ref 30.0–36.0)
MCV: 91.1 fl (ref 78.0–100.0)
Monocytes Absolute: 0.8 10*3/uL (ref 0.1–1.0)
Monocytes Relative: 12.4 % — ABNORMAL HIGH (ref 3.0–12.0)
Neutro Abs: 3.7 10*3/uL (ref 1.4–7.7)
Neutrophils Relative %: 54.6 % (ref 43.0–77.0)
Platelets: 248 10*3/uL (ref 150.0–400.0)
RBC: 4.91 Mil/uL (ref 3.87–5.11)
RDW: 13.7 % (ref 11.5–15.5)
WBC: 6.7 10*3/uL (ref 4.0–10.5)

## 2018-02-05 LAB — COMPREHENSIVE METABOLIC PANEL
ALT: 22 U/L (ref 0–35)
AST: 17 U/L (ref 0–37)
Albumin: 4.2 g/dL (ref 3.5–5.2)
Alkaline Phosphatase: 60 U/L (ref 39–117)
BUN: 24 mg/dL — ABNORMAL HIGH (ref 6–23)
CO2: 27 mEq/L (ref 19–32)
Calcium: 9.6 mg/dL (ref 8.4–10.5)
Chloride: 107 mEq/L (ref 96–112)
Creatinine, Ser: 1.06 mg/dL (ref 0.40–1.20)
GFR: 57.4 mL/min — ABNORMAL LOW (ref >60.00)
Glucose, Bld: 96 mg/dL (ref 70–99)
Potassium: 4.5 mEq/L (ref 3.5–5.1)
Sodium: 141 mEq/L (ref 135–145)
Total Bilirubin: 0.3 mg/dL (ref 0.2–1.2)
Total Protein: 7.1 g/dL (ref 6.0–8.3)

## 2018-02-05 LAB — TSH: TSH: 3.75 u[IU]/mL (ref 0.35–4.50)

## 2018-02-05 LAB — T4, FREE: Free T4: 0.92 ng/dL (ref 0.60–1.60)

## 2018-02-05 MED ORDER — MELOXICAM 7.5 MG PO TABS: 7.5000 mg | ORAL_TABLET | Freq: Every day | ORAL | 0 refills | Status: DC

## 2018-02-05 NOTE — Progress Notes (Signed)
Subjective:    Maria Cobb is a 52 y.o. female and is here for a comprehensive physical exam.  1. Hypothyroidism. Stable on thyroxine 25 mcg for years. She denies any skin changes, weight gain or weight loss, or bowel issues.     2. Gastroesophageal reflux disease. Stable on Nexium. The patient denies any nausea, vomiting, reflux symptoms. She has no melena.    3. Menorrhagia with irregular cycle. Off HRT. Hoping to normalize.   8. Bilateral hip pain. New. Started after cholecystectomy. Pain with palpation. No radiation. Not treatment.    9. Acne vulgaris. Adult acne that is controlled using the Doxy.   Health Maintenance Due  Topic Date Due  . Hepatitis C Screening  07/17/1966  . HIV Screening  01/04/1979   PMHx, SurgHx, SocialHx, Medications, and Allergies were reviewed in the Visit Navigator and updated as appropriate.   Past Medical History:  Diagnosis Date  . Asthma    mild  . Diverticulosis   . GERD (gastroesophageal reflux disease)   . Heart murmur   . History of bilateral breast implants   . Hypothyroidism   . Lumbar spine strain   . Menorrhagia   . Postmenopausal HRT (hormone replacement therapy)   . SCC (squamous cell carcinoma)     Past Surgical History:  Procedure Laterality Date  . CHOLECYSTECTOMY N/A 09/19/2017   Procedure: LAPAROSCOPIC CHOLECYSTECTOMY  WITH  INTRAOPERATIVE CHOLANGIOGRAM ERAS PATHWAY;  Surgeon: Greer Pickerel, MD;  Location: [facility];  Service: General;  Laterality: N/A;  . PLACEMENT OF BREAST IMPLANTS  1990    Family History  Problem Relation Age of Onset  . Heart disease Mother   . High Cholesterol Mother   . Stroke Maternal Grandfather   . Prostate cancer Father   . Colon cancer Neg Hx   . Esophageal cancer Neg Hx   . Rectal cancer Neg Hx    Social History   Tobacco Use  . Smoking status: Never Smoker  . Smokeless tobacco: Never Used  Substance Use Topics  . Alcohol use: Yes    Comment: 1 weekly  . Drug use: No      Review of Systems:   Pertinent items are noted in the HPI. Otherwise, ROS is negative.  Objective:   BP 108/68   Pulse 75   Temp 97.7 F (36.5 C) (Oral)   Ht 5\' 9"  (1.753 m)   Wt 161 lb 12.8 oz (73.4 kg)   LMP 01/29/2018   SpO2 99%   BMI 23.89 kg/m    Wt Readings from Last 3 Encounters:  02/05/18 161 lb 12.8 oz (73.4 kg)  09/14/17 160 lb 12.8 oz (72.9 kg)  04/06/17 148 lb (67.1 kg)     Ht Readings from Last 3 Encounters:  02/05/18 5\' 9"  (1.753 m)  09/14/17 5\' 9"  (1.753 m)  04/06/17 5\' 9"  (1.753 m)   General appearance: alert, cooperative and appears stated age. Head: normocephalic, without obvious abnormality, atraumatic. Neck: no adenopathy, supple, symmetrical, trachea midline; thyroid not enlarged, symmetric, no tenderness/mass/nodules. Lungs: clear to auscultation bilaterally. Heart: regular rate and rhythm Abdomen: soft, non-tender; no masses,  no organomegaly. Extremities: extremities normal, atraumatic, no cyanosis or edema. Skin: skin color, texture, turgor normal, no rashes or lesions. Lymph: cervical, supraclavicular, and axillary nodes normal; no abnormal inguinal nodes palpated. Neurologic: grossly normal.  Assessment/Plan:   Maria Cobb was seen today for annual exam.  Diagnoses and all orders for this visit:  Routine physical examination  Acquired hypothyroidism -  TSH -     T4, free  Pure hypercholesterolemia -     CBC with Differential/Platelet -     Comprehensive metabolic panel -     Lipid panel  Encounter for screening for HIV -     HIV antibody  Need for hepatitis C screening test -     Hepatitis C antibody  Palpitations -     EKG 12-Lead  Statin declined  Trochanteric bursitis of both hips -     meloxicam (MOBIC) 7.5 MG tablet; Take 1 tablet (7.5 mg total) by mouth daily.   Patient Counseling: [x]    Nutrition: Stressed importance of moderation in sodium/caffeine intake, saturated fat and cholesterol, caloric balance,  sufficient intake of fresh fruits, vegetables, fiber, calcium, iron, and 1 mg of folate supplement per day (for females capable of pregnancy).  [x]    Stressed the importance of regular exercise.   [x]    Substance Abuse: Discussed cessation/primary prevention of tobacco, alcohol, or other drug use; driving or other dangerous activities under the influence; availability of treatment for abuse.   [x]    Injury prevention: Discussed safety belts, safety helmets, smoke detector, smoking near bedding or upholstery.   [x]    Sexuality: Discussed sexually transmitted diseases, partner selection, use of condoms, avoidance of unintended pregnancy  and contraceptive alternatives.  [x]    Dental health: Discussed importance of regular tooth brushing, flossing, and dental visits.  [x]    Health maintenance and immunizations reviewed. Please refer to Health maintenance section.   Briscoe Deutscher, DO [place]

## 2018-02-06 LAB — HEPATITIS C ANTIBODY
Hepatitis C Ab: NONREACTIVE
SIGNAL TO CUT-OFF: 0.01 (ref <1.00)

## 2018-02-06 LAB — HIV ANTIBODY (ROUTINE TESTING W REFLEX): HIV 1&2 Ab, 4th Generation: NONREACTIVE

## 2018-02-23 DIAGNOSIS — M9903 Segmental and somatic dysfunction of lumbar region: Secondary | ICD-10-CM | POA: Diagnosis not present

## 2018-02-23 DIAGNOSIS — M9905 Segmental and somatic dysfunction of pelvic region: Secondary | ICD-10-CM | POA: Diagnosis not present

## 2018-02-23 DIAGNOSIS — M9904 Segmental and somatic dysfunction of sacral region: Secondary | ICD-10-CM | POA: Diagnosis not present

## 2018-03-05 DIAGNOSIS — M9905 Segmental and somatic dysfunction of pelvic region: Secondary | ICD-10-CM | POA: Diagnosis not present

## 2018-03-05 DIAGNOSIS — M9904 Segmental and somatic dysfunction of sacral region: Secondary | ICD-10-CM | POA: Diagnosis not present

## 2018-03-05 DIAGNOSIS — M9903 Segmental and somatic dysfunction of lumbar region: Secondary | ICD-10-CM | POA: Diagnosis not present

## 2018-03-17 ENCOUNTER — Other Ambulatory Visit: Payer: Self-pay | Admitting: Physician Assistant

## 2018-03-28 ENCOUNTER — Encounter: Payer: Self-pay | Admitting: Family Medicine

## 2018-04-03 ENCOUNTER — Other Ambulatory Visit: Payer: Self-pay

## 2018-04-03 DIAGNOSIS — S39012S Strain of muscle, fascia and tendon of lower back, sequela: Secondary | ICD-10-CM

## 2018-04-03 NOTE — Progress Notes (Signed)
re

## 2018-04-06 ENCOUNTER — Ambulatory Visit: Payer: 59 | Admitting: Sports Medicine

## 2018-04-06 ENCOUNTER — Encounter: Payer: Self-pay | Admitting: Sports Medicine

## 2018-04-06 VITALS — BP 116/78 | HR 71 | Ht 69.0 in | Wt 160.0 lb

## 2018-04-06 DIAGNOSIS — R29898 Other symptoms and signs involving the musculoskeletal system: Secondary | ICD-10-CM | POA: Diagnosis not present

## 2018-04-06 DIAGNOSIS — M24551 Contracture, right hip: Secondary | ICD-10-CM | POA: Diagnosis not present

## 2018-04-06 DIAGNOSIS — M9908 Segmental and somatic dysfunction of rib cage: Secondary | ICD-10-CM

## 2018-04-06 DIAGNOSIS — M9904 Segmental and somatic dysfunction of sacral region: Secondary | ICD-10-CM

## 2018-04-06 DIAGNOSIS — M9902 Segmental and somatic dysfunction of thoracic region: Secondary | ICD-10-CM | POA: Diagnosis not present

## 2018-04-06 DIAGNOSIS — M9903 Segmental and somatic dysfunction of lumbar region: Secondary | ICD-10-CM

## 2018-04-06 DIAGNOSIS — M9905 Segmental and somatic dysfunction of pelvic region: Secondary | ICD-10-CM

## 2018-04-06 NOTE — Progress Notes (Signed)
PROCEDURE NOTE: THERAPEUTIC EXERCISES (97110) 15 minutes spent for Therapeutic exercises as below and as referenced in the AVS.  This included exercises focusing on stretching, strengthening, with significant focus on eccentric aspects.   Proper technique shown and discussed handout in great detail with ATC.  All questions were discussed and answered.   Long term goals include an improvement in range of motion, strength, endurance as well as avoiding reinjury. Frequency of visits is one time as determined during today's  office visit. Frequency of exercises to be performed is as per handout.  EXERCISES REVIEWED:  Maria Cobb Exercises  Hip ABduction strengthening with focus on Glute Medius Recruitment  hip flexor stretching

## 2018-04-06 NOTE — Progress Notes (Signed)
Maria Cobb. Maria Cobb, Maria Cobb at [facility] [phone]  Maria Cobb - 52 y.o. female MRN [id number]  Date of birth: 08-11-1966  Visit Date: 04/06/2018  PCP: Maria Deutscher, DO   Referred by: Maria Deutscher, DO  Scribe(s) for today's visit: Maria Cobb, LAT, ATC  SUBJECTIVE:  Maria Cobb is here for New Patient (Initial Visit) (B hip and leg pain) .  Referred by: Dr. Juleen Cobb  Her B hip and leg pain (R>L)symptoms INITIALLY: Began in January 2019 after she had a cholecystectomy w/ no known MOI.  She started working out again after her surgery and felt like it resolved slightly.  She does cardio mainly to workout.  She saw Dr. Juleen Cobb and was prescribed an anti-inflammatory which helped some but didn't completely resolve. Described as moderate bruising/fatigue-type pain, radiating to B lateral thighs Worsened with sitting and sleeping on her side Improved with Mobic Additional associated symptoms include: no N/T and no mechanical symptoms at the hips or back.  Does note some popping/clicking in her B knees    At this time symptoms are worsening compared to onset due to being more fatigued by the symptoms. She has been taking Mobic prescribed by Dr. Juleen Cobb.  She's seen a chiropractor in the past w/ no relief.  Had hip/pelvis XR in March 2019 at chiropractor's office ([place]).   REVIEW OF SYSTEMS: Reports night time disturbances. Reports fevers, chills, or night sweats.  Due to menopause. Denies unexplained weight loss. Reports personal history of cancer.  Yes to skin cancer. Denies changes in bowel or bladder habits. Denies recent unreported falls. Denies new or worsening dyspnea or wheezing. Denies headaches or dizziness.  Denies numbness, tingling or weakness  In the extremities.  Denies dizziness or presyncopal episodes Denies lower extremity edema    HISTORY & PERTINENT PRIOR DATA:    Significant/pertinent history, findings, studies include:  reports that she has never smoked. She has never used smokeless tobacco. No results for input(s): HGBA1C, LABURIC, CREATINE in the last 8760 hours. No specialty comments available. No problems updated.  Otherwise prior history reviewed and updated per electronic medical record.   OBJECTIVE:  VS:  HT:5\' 9"  (175.3 cm)   WT:160 lb (72.6 kg)  BMI:23.62    BP:116/78  HR:71bpm  TEMP: ( )  RESP:99 %   PHYSICAL EXAM: CONSTITUTIONAL: Well-developed, Well-nourished and In no acute distress Alert & appropriately interactive. and Not depressed or anxious appearing. RESPIRATORY: No increased work of breathing and Trachea Midline EYES: Pupils are equal., EOM intact without nystagmus. and No scleral icterus.  Lower extremities: Warm and well perfused NEURO: unremarkable Normal associated myotomal distribution strength to manual muscle testing Normal sensation to light touch Normal and symmetric associated DTRs  MSK Exam: BACK Exam: Normal alignment & Contours Skin: No overlying erythema/ecchymosis  MOTOR TESTING: Intact in all LE myotomes and Able to heel and toe walk without difficutly        RIGHT    LEFT Straight leg raise-------------------------: normal, no pain                         normal, no pain, Slight tightness in the hamstrings bilaterally       REFLEXES Right Left  DTR - L3/4 -Patellar 2+ 2+  DTR - L5/S1 - Achilles 2+ 2+     PROCEDURES & DATA REVIEWED:  . Osteopathic manipulation was performed today based on physical exam findings.  Please see  procedure note for further information including Osteopathic Exam findings . Discussed the foundation of treatment for this condition is physical therapy and/or daily (5-6 days/week) therapeutic exercises, focusing on core strengthening, coordination, neuromuscular control/reeducation.  Therapeutic exercises prescribed per procedure note.  ASSESSMENT   1.  Weakness of both hips   2. Right hip flexor tightness   3. Somatic dysfunction of thoracic region   4. Somatic dysfunction of rib cage region   5. Somatic dysfunction of pelvis region   6. Somatic dysfunction of lumbar region   7. Somatic dysfunction of sacral region     PLAN:       . Please see procedure section and notes. . Links to [employer] provided today per Patient Instructions.  These exercises were developed by Maria Cobb, DC with a strong emphasis on core neuromuscular reducation and postural realignment through body-weight exercises. . If any lack of improvement consider further diagnostic evaluation with Repeat x-rays and/or MRI    No problem-specific Assessment & Plan notes found for this encounter.   Follow-up: Return in about 2 weeks (around 04/20/2018).      Please see additional documentation for Objective, Assessment and Plan sections. Pertinent additional documentation may be included in corresponding procedure notes, imaging studies, problem based documentation and patient instructions. Please see these sections of the encounter for additional information regarding this visit.  CMA/ATC served as Education administrator during this visit. History, Physical, and Plan performed by medical provider. Documentation and orders reviewed and attested to.      Maria Cobb, [place] [facility] Physician

## 2018-04-06 NOTE — Patient Instructions (Signed)
Also check out [employer]" which is a program developed by Dr. Minerva Ends.   There are links to a couple of his YouTube Videos below and I would like to see performing one of his videos 5-6 days per week.    A good intro video is: "Independence from Pain 7-minute Video" - [email or web address]   Exercises that focus more on the neck are as below: Dr. Archie Balboa with [facility] teaching neck and shoulder details Part 1 - https://[email or web address].[email or web address] Part 2 Dr. Archie Balboa with [facility] quick routine to practice daily - https://[email or web address].[email or web address]  Do not try to attempt the entire video when first beginning.    Try breaking of each exercise that he goes into shorter segments.  Otherwise if they perform an exercise for 45 seconds, start with 15 seconds and rest and then resume when they begin the new activity.  If you work your way up to being able to do these videos without having to stop, I expect you will see significant improvements in your pain.  If you enjoy his videos and would like to find out more you can look on his website: [email or web address]  He has a workout streaming option as well as a DVD set available for purchase.  Amazon has the best price for his DVDs.     Please perform the exercise program that we have prepared for you and gone over in detail on a daily basis.  In addition to the handout you were provided you can access your program through: [email or web address]   Your unique program code is:  [email or web address]

## 2018-04-06 NOTE — Progress Notes (Signed)
PROCEDURE NOTE : OSTEOPATHIC MANIPULATION The decision today to treat with Osteopathic Manipulative Therapy (OMT) was based on physical exam findings. Verbal consent was obtained following a discussion with the patient regarding the of risks, benefits and potential side effects, including an acute pain flare,post manipulation soreness and need for repeat treatments.     Contraindications to OMT: NONE  Manipulation was performed as below: Regions treated: Ribs, Thoracic spine, Lumbar spine, Pelvis and Sacrum OMT Techniques Used: HVLA, muscle energy and myofascial release  The patient tolerated the treatment well and reported Improved symptoms following treatment today. Patient was given medications, exercises, stretches and lifestyle modifications per AVS and verbally.   OSTEOPATHIC/STRUCTURAL EXAM:   T4 -8 Neutral, Rotated LEFT, Sidebent RIGHT Rib 6 Right  Posterior L3 FRS right (Flexed, Rotated & Sidebent) Right psoas spasm Right anterior innonimate L on L sacral torsion

## 2018-04-20 ENCOUNTER — Encounter: Payer: Self-pay | Admitting: Sports Medicine

## 2018-04-20 ENCOUNTER — Ambulatory Visit (INDEPENDENT_AMBULATORY_CARE_PROVIDER_SITE_OTHER): Payer: 59 | Admitting: Sports Medicine

## 2018-04-20 VITALS — BP 112/70 | HR 70 | Ht 69.0 in | Wt 161.2 lb

## 2018-04-20 DIAGNOSIS — M24551 Contracture, right hip: Secondary | ICD-10-CM | POA: Diagnosis not present

## 2018-04-20 DIAGNOSIS — M25552 Pain in left hip: Secondary | ICD-10-CM

## 2018-04-20 DIAGNOSIS — M9905 Segmental and somatic dysfunction of pelvic region: Secondary | ICD-10-CM | POA: Diagnosis not present

## 2018-04-20 DIAGNOSIS — R29898 Other symptoms and signs involving the musculoskeletal system: Secondary | ICD-10-CM | POA: Diagnosis not present

## 2018-04-20 DIAGNOSIS — M9904 Segmental and somatic dysfunction of sacral region: Secondary | ICD-10-CM | POA: Diagnosis not present

## 2018-04-20 DIAGNOSIS — M25551 Pain in right hip: Secondary | ICD-10-CM

## 2018-04-20 DIAGNOSIS — M9903 Segmental and somatic dysfunction of lumbar region: Secondary | ICD-10-CM | POA: Diagnosis not present

## 2018-04-20 MED ORDER — AMITRIPTYLINE HCL 25 MG PO TABS: 25.0000 mg | ORAL_TABLET | Freq: Every day | ORAL | 3 refills | Status: DC

## 2018-04-20 NOTE — Progress Notes (Signed)
Maria Cobb. Maria Cobb, [place] at [facility] [phone]  Maria Cobb - 52 y.o. female MRN [id number]  Date of birth: [birth year]  Visit Date: 04/20/2018  PCP: Briscoe Deutscher, DO   Referred by: Briscoe Deutscher, DO  Scribe(s) for today's visit: Josepha Pigg, CMA  SUBJECTIVE:  Maria Cobb is here for Follow-up (B hip weakness)   04/06/2018: Her B hip and leg pain (R>L)symptoms INITIALLY: Began in January 2019 after she had a cholecystectomy w/ no known MOI.  She started working out again after her surgery and felt like it resolved slightly.  She does cardio mainly to workout.  She saw Dr. Juleen China and was prescribed an anti-inflammatory which helped some but didn't completely resolve. Described as moderate bruising/fatigue-type pain, radiating to B lateral thighs Worsened with sitting and sleeping on her side Improved with Mobic Additional associated symptoms include: no N/T and no mechanical symptoms at the hips or back.  Does note some popping/clicking in her B knees   At this time symptoms are worsening compared to onset due to being more fatigued by the symptoms. She has been taking Mobic prescribed by Dr. Juleen China.  She's seen a chiropractor in the past w/ no relief.  Had hip/pelvis XR in March 2019 at chiropractor's office ([place]).  04/20/2018: Compared to the last office visit, her previously described symptoms are improving but not at 100%, feels like she is at about 80%.  Current symptoms are moderate & are radiating to to B thighs. She reports continued sleep disturbance. She has been doing HEP and foundation training with little trouble. At times she feels like she may not be doing the videos correctly. She feels like she gets a lot of relief from lying on the bed to stretch her hip flexor. She takes IBU prn with some relief.  She continues to see her chiropractor once weekly.   She has questions about  medications and diet that could be affecting inflammation.    REVIEW OF SYSTEMS: Reports night time disturbances. Reports night sweats. Denies unexplained weight loss. Reports personal history of skin cancer. Denies changes in bowel or bladder habits. Denies recent unreported falls. Denies new or worsening dyspnea or wheezing. Denies headaches or dizziness.  Denies numbness, tingling or weakness  In the extremities.  Denies dizziness or presyncopal episodes Denies lower extremity edema    HISTORY & PERTINENT PRIOR DATA:  Prior History reviewed and updated per electronic medical record.  Significant/pertinent history, findings, studies include:  reports that she has never smoked. She has never used smokeless tobacco. No results for input(s): HGBA1C, LABURIC, CREATINE in the last 8760 hours. No specialty comments available. No problems updated.  OBJECTIVE:  VS:  HT:5\' 9"  (175.3 cm)   WT:161 lb 3.2 oz (73.1 kg)  BMI:23.79    BP:112/70  HR:70bpm  TEMP: ( )  RESP:99 %   PHYSICAL EXAM: CONSTITUTIONAL: Well-developed, Well-nourished and In no acute distress Alert & appropriately interactive. and Not depressed or anxious appearing. Respiratory: No increased work of breathing.  Trachea Midline EYES: Pupils are equal., EOM intact without nystagmus. and No scleral icterus.  Upper EXTREMITY EXAM: Warm and well perfused NEURO: unremarkable  MSK Exam: Lumbar spine is slightly tight with anterior pelvic rotation. Good internal and external rotation of bilateral hips.  PROCEDURES & DATA REVIEWED:  PROCEDURE NOTE : OSTEOPATHIC MANIPULATION The decision today to treat with Osteopathic Manipulative Therapy (OMT) was based on physical exam findings. Verbal consent was obtained following a  discussion with the patient regarding the of risks, benefits and potential side effects, including an acute pain flare,post manipulation soreness and need for repeat treatments.     Contraindications  to OMT: NONE  Manipulation was performed as below: Regions treated: Lumbar spine, Pelvis and Sacrum OMT Techniques Used: HVLA, muscle energy, myofascial release, soft tissue and facilitated positional release  The patient tolerated the treatment well and reported Improved symptoms following treatment today. Patient was given medications, exercises, stretches and lifestyle modifications per AVS and verbally.   OSTEOPATHIC/STRUCTURAL EXAM:   L3 FRS right (Flexed, Rotated & Sidebent) Right psoas spasm Right anterior innonimate SACRUM: L on L sacral torsion    ASSESSMENT   1. Weakness of both hips   2. Right hip flexor tightness   3. Somatic dysfunction of pelvis region   4. Somatic dysfunction of sacral region   5. Somatic dysfunction of lumbar region   6. Pain of both hip joints     PLAN:  Osteopathic manipulation was performed today based on physical exam findings.  Please see procedure note for further information including Osteopathic Exam findings  She continues to do well and is having good improvements with OMT.  Continue with home exercise program.  Given the ongoing muscle tightness and some nighttime disturbances we will have her start Elavil to see if this is beneficial.  No problem-specific Assessment & Plan notes found for this encounter. No orders of the defined types were placed in this encounter.  Meds ordered this encounter  Medications  . amitriptyline (ELAVIL) 25 MG tablet    Sig: Take 1 tablet (25 mg total) by mouth at bedtime.    Dispense:  30 tablet    Refill:  3     Follow-up: Return in about 2 weeks (around 05/04/2018) for consideration of repeat Osteopathic Manipulation.      Please see additional documentation for Objective, Assessment and Plan sections. Pertinent additional documentation may be included in corresponding procedure notes, imaging studies, problem based documentation and patient instructions. Please see these sections of the  encounter for additional information regarding this visit.  CMA/ATC served as Education administrator during this visit. History, Physical, and Plan performed by medical provider. Documentation and orders reviewed and attested to.      Gerda Diss, [place] [facility] Physician

## 2018-04-21 ENCOUNTER — Encounter: Payer: Self-pay | Admitting: Sports Medicine

## 2018-05-06 ENCOUNTER — Encounter: Payer: Self-pay | Admitting: Sports Medicine

## 2018-05-18 ENCOUNTER — Encounter: Payer: Self-pay | Admitting: Sports Medicine

## 2018-05-18 ENCOUNTER — Ambulatory Visit: Payer: 59 | Admitting: Sports Medicine

## 2018-05-18 ENCOUNTER — Ambulatory Visit (INDEPENDENT_AMBULATORY_CARE_PROVIDER_SITE_OTHER): Payer: 59

## 2018-05-18 VITALS — BP 120/70 | HR 70 | Ht 69.0 in | Wt 164.4 lb

## 2018-05-18 DIAGNOSIS — M25551 Pain in right hip: Secondary | ICD-10-CM

## 2018-05-18 DIAGNOSIS — M25552 Pain in left hip: Secondary | ICD-10-CM

## 2018-05-18 DIAGNOSIS — M160 Bilateral primary osteoarthritis of hip: Secondary | ICD-10-CM | POA: Diagnosis not present

## 2018-05-18 MED ORDER — IBUPROFEN-FAMOTIDINE 800-26.6 MG PO TABS: 1.0000 | ORAL_TABLET | Freq: Three times a day (TID) | ORAL | 0 refills | Status: AC | PRN

## 2018-05-18 MED ORDER — IBUPROFEN-FAMOTIDINE 800-26.6 MG PO TABS: 1.0000 | ORAL_TABLET | Freq: Three times a day (TID) | ORAL | 2 refills | Status: DC | PRN

## 2018-05-18 NOTE — Progress Notes (Signed)
[name]. Ranette [place]adoo, [place] at [facility] [phone]  Abigaile Rossie - 52 y.o. female MRN [id number]  Date of birth: 05-17-1966  Visit Date: 05/18/2018  PCP: Briscoe Deutscher, DO   Referred by: Briscoe Deutscher, DO  Scribe(s) for today's visit: Josepha Pigg, CMA  SUBJECTIVE:  Maria Cobb is here for Follow-up (B hip pain/weakness)  04/06/2018: Her B hip and leg pain (R>L)symptoms INITIALLY: Began in January 2019 after she had a cholecystectomy w/ no known MOI.  She started working out again after her surgery and felt like it resolved slightly.  She does cardio mainly to workout.  She saw Dr. Juleen China and was prescribed an anti-inflammatory which helped some but didn't completely resolve. Described as moderate bruising/fatigue-type pain, radiating to B lateral thighs Worsened with sitting and sleeping on her side Improved with Mobic Additional associated symptoms include: no N/T and no mechanical symptoms at the hips or back.  Does note some popping/clicking in her B knees   At this time symptoms are worsening compared to onset due to being more fatigued by the symptoms. She has been taking Mobic prescribed by Dr. Juleen China.  She's seen a chiropractor in the past w/ no relief.  Had hip/pelvis XR in March 2019 at chiropractor's office ([place]).  04/20/2018: Compared to the last office visit, her previously described symptoms are improving but not at 100%, feels like she is at about 80%.  Current symptoms are moderate & are radiating to to B thighs. She reports continued sleep disturbance. She has been doing HEP and foundation training with little trouble. At times she feels like she may not be doing the videos correctly. She feels like she gets a lot of relief from lying on the bed to stretch her hip flexor. She takes IBU prn with some relief.  She continues to see her chiropractor once weekly.   She has questions  about medications and diet that could be affecting inflammation.   05/18/2018: Compared to the last office visit on 04/20/18, her previously described B hip pain and weakness symptoms show no change.  She states that her symptoms vary depending on activity.  Yesterday was not a great day because she had to drive back from [place].  She notes that she is only taking the Elavil a few times/week due to side effects.  She states that she only feels a total of 20% improved. Current symptoms are moderate & are radiating to B hips and thighs.  She con't to have issues w/ sleep due to not being able to lay on one side for too long. She has been taking IBU and Elavil.  She goes to a Restaurant manager, fast food weekly.  She has been doing her HEP and [employer].  REVIEW OF SYSTEMS: Reports night time disturbances. Reports night sweats. Denies unexplained weight loss. Reports personal history of skin cancer. Denies changes in bowel or bladder habits. Denies recent unreported falls. Denies new or worsening dyspnea or wheezing. Denies headaches or dizziness.  Denies numbness, tingling or weakness  In the extremities.  Denies dizziness or presyncopal episodes Denies lower extremity edema    HISTORY:  Prior history reviewed and updated per electronic medical record.  Social History   Occupational History  . Occupation: Scientist, clinical (histocompatibility and immunogenetics): [employer]  Tobacco Use  . Smoking status: Never Smoker  . Smokeless tobacco: Never Used  Substance and Sexual Activity  . Alcohol use: Yes    Comment: 1 weekly  .  Drug use: No  . Sexual activity: Yes    Partners: Male    Birth control/protection: Pill   Social History   Social History Narrative  . Not on file    DATA OBTAINED & REVIEWED:  No results for input(s): HGBA1C, LABURIC, CREATINE in the last 8760 hours. 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   OBJECTIVE:  VS:  HT:5\' 9"  (175.3 cm)   WT:164  lb 6.4 oz (74.6 kg)  BMI:24.27    BP:120/70  HR:70bpm  TEMP: ( )  RESP:97 %   PHYSICAL EXAM: CONSTITUTIONAL: Well-developed, Well-nourished and In no acute distress PSYCHIATRIC: Alert & appropriately interactive. and Not depressed or anxious appearing. RESPIRATORY: No increased work of breathing and Trachea Midline EYES: Pupils are equal., EOM intact without nystagmus. and No scleral icterus.  Upper and Lower extremities: Warm and well perfused NEURO: unremarkable  MSK Exam: Bilateral hips and back  . Well aligned, no significant deformity. . No focal bony tenderness . No overlying skin changes.   RANGE OF MOTION & STRENGTH  . Normal flexion-extension of bilateral knees and hips.   SPECIALITY TESTING:   Log Roll: normal, no pain  FADIR: positive, mild pain and Minimally on the right, moderately on the left  FABER: normal, no pain  Stinchfield testing: normal, no pain Strength: Normal  Axial loading produces: Mild pain and No crepitation     ASSESSMENT   1. Left hip pain   2. Pain of both hip joints     PROCEDURES  None  PLAN:   Meds ordered this encounter  Medications  . Ibuprofen-Famotidine (DUEXIS) 800-26.6 MG TABS    Sig: Take 1 tablet by mouth 3 (three) times daily as needed. 1 tab po tid X 14 days then 1 tab po tid as needed    Dispense:  90 tablet    Refill:  2    Home Phone      [phone] Mobile          [phone]   . Ibuprofen-Famotidine (DUEXIS) 800-26.6 MG TABS    Sig: Take 1 tablet by mouth 3 (three) times daily as needed for up to 1 day.    Dispense:  9 tablet    Refill:  0    Pertinent additional documentation may be included in corresponding procedure notes, imaging studies, problem based documentation and patient instructions.  Discussed red flag symptoms that warrant earlier emergent evaluation and patient voices understanding.  Activity modifications and the importance of avoiding exacerbating activities (limiting pain to no  more than a 4 / 10 during or following activity) recommended and discussed.  >50% of this 25 minutes minute visit spent in direct patient counseling and/or coordination of care. Discussion was focused on education regarding the in discussing the pathoetiology and anticipated clinical course of the above condition.  Follow-up: Return in about 4 weeks (around 06/15/2018).   If any lack of improvement consider further diagnostic evaluation with MR arthrogram or Diagnostic and therapeutic intra-articular injection     CMA/ATC served as scribe during this visit. History, Physical, and Plan performed by medical provider.  Documentation and orders reviewed and attested to.      Gerda Diss, [place] Sports Medicine Physician

## 2018-05-18 NOTE — Patient Instructions (Signed)
[facility] instructions for Duexis, Pennsaid and Vimovo:  Your prescription will be filled through a mail order pharmacy.  It is typically [facility] but may vary depending on where you live.  You will receive a phone call from them which will typically come from a [phone] phone number.  You must speak directly to them to have this medication filled.  When the pharmacy calls, they will need your mailing address (for overnight shipment of the medication) andy they will need payment information if you have a copay (typically no more than $10). If you have not heard from them 2-3 days after your appointment with Dr. Rigby, contact us at the office ([phone]) or through MyChart so we can reach back out to the pharmacy.  

## 2018-05-19 ENCOUNTER — Encounter: Payer: Self-pay | Admitting: Sports Medicine

## 2018-05-24 ENCOUNTER — Telehealth: Payer: Self-pay | Admitting: Family Medicine

## 2018-05-24 NOTE — Telephone Encounter (Signed)
Copied from [place] [phone]. Topic: General - Other >> May 24, 2018  3:26 PM Cecelia Byars, NT wrote: Reason for CRM: Patient called and says she has not received her Ibuprofen-Famotidine (DUEXIS) 800-26.6 MG TABS yet and she would like to know what to take until it comes or she should come in and get another sample packet until it arrives, please call her at  [phone]

## 2018-05-24 NOTE — Telephone Encounter (Signed)
Please advise 

## 2018-05-25 NOTE — Telephone Encounter (Signed)
Please advise on refill.

## 2018-05-25 NOTE — Telephone Encounter (Signed)
Spoke with patient, she will take OTC IBU (4 200 mg ) and OTC famotidine until rx arrives. She has been contacted by the pharmacy and the product should be on the way per pt report.

## 2018-05-25 NOTE — Telephone Encounter (Signed)
Prescribed by Paulla Fore?

## 2018-06-18 NOTE — Progress Notes (Deleted)
Maria Cobb is a 52 y.o. female is here for follow up.  History of Present Illness:   {CMA SCRIBE ATTESTATION}  HPI:   Health Maintenance Due  Topic Date Due  . INFLUENZA VACCINE  04/05/2018   Depression screen [facility] 2/9 02/05/2018 02/02/2017  Decreased Interest 0 0  Down, Depressed, Hopeless 0 0  PHQ - 2 Score 0 0   PMHx, SurgHx, SocialHx, FamHx, Medications, and Allergies were reviewed in the Visit Navigator and updated as appropriate.   Patient Active Problem List   Diagnosis Date Noted  . Routine physical examination 02/05/2018  . HLD (hyperlipidemia) 02/05/2018  . Hypothyroidism 02/02/2017  . Gastroesophageal reflux disease 02/02/2017  . Menorrhagia with irregular cycle 02/02/2017  . Acne vulgaris 02/02/2017  . Postmenopausal HRT (hormone replacement therapy)   . History of bilateral breast implants    Social History   Tobacco Use  . Smoking status: Never Smoker  . Smokeless tobacco: Never Used  Substance Use Topics  . Alcohol use: Yes    Comment: 1 weekly  . Drug use: No   Current Medications and Allergies:   Current Outpatient Medications:  .  amitriptyline (ELAVIL) 25 MG tablet, Take 1 tablet (25 mg total) by mouth at bedtime., Disp: 30 tablet, Rfl: 3 .  fluocinonide ointment (LIDEX) 5.40 %, Apply 1 application topically 2 (two) times daily. (Patient taking differently: Apply 1 application topically 2 (two) times daily as needed (for rash). ), Disp: 30 g, Rfl: 0 .  ibuprofen (ADVIL,MOTRIN) 200 MG tablet, Take 400 mg by mouth every 6 (six) hours as needed for headache or moderate pain., Disp: , Rfl:  .  Ibuprofen-Famotidine (DUEXIS) 800-26.6 MG TABS, Take 1 tablet by mouth 3 (three) times daily as needed. 1 tab po tid X 14 days then 1 tab po tid as needed, Disp: 90 tablet, Rfl: 2 .  levothyroxine (SYNTHROID, LEVOTHROID) 25 MCG tablet, Take 25 mcg by mouth at bedtime., Disp: , Rfl:  .  meloxicam (MOBIC) 7.5 MG tablet, Take 1 tablet (7.5 mg total) by mouth  daily., Disp: 30 tablet, Rfl: 0 .  minocycline (MINOCIN,DYNACIN) 100 MG capsule, Take 100 mg by mouth daily., Disp: , Rfl:  .  Multiple Vitamins-Minerals (MULTIVITAMIN ADULTS) TABS, Take 1 tablet by mouth daily., Disp: , Rfl:  .  pantoprazole (PROTONIX) 40 MG tablet, Take 1 tablet (40 mg total) by mouth daily., Disp: 90 tablet, Rfl: 1  Allergies  Allergen Reactions  . Tape Rash   Review of Systems   Pertinent items are noted in the HPI. Otherwise, ROS is negative.  Vitals:  There were no vitals filed for this visit.   There is no height or weight on file to calculate BMI.  Physical Exam:   Physical Exam  Results for orders placed or performed in visit on 02/05/18  CBC with Differential/Platelet  Result Value Ref Range   WBC 6.7 4.0 - 10.5 K/uL   RBC 4.91 3.87 - 5.11 Mil/uL   Hemoglobin 14.9 12.0 - 15.0 g/dL   HCT 44.7 36.0 - 46.0 %   MCV 91.1 78.0 - 100.0 fl   MCHC 33.4 30.0 - 36.0 g/dL   RDW 13.7 11.5 - 15.5 %   Platelets 248.0 150.0 - 400.0 K/uL   Neutrophils Relative % 54.6 43.0 - 77.0 %   Lymphocytes Relative 31.1 12.0 - 46.0 %   Monocytes Relative 12.4 (H) 3.0 - 12.0 %   Eosinophils Relative 1.3 0.0 - 5.0 %   Basophils Relative 0.6  0.0 - 3.0 %   Neutro Abs 3.7 1.4 - 7.7 K/uL   Lymphs Abs 2.1 0.7 - 4.0 K/uL   Monocytes Absolute 0.8 0.1 - 1.0 K/uL   Eosinophils Absolute 0.1 0.0 - 0.7 K/uL   Basophils Absolute 0.0 0.0 - 0.1 K/uL  Comprehensive metabolic panel  Result Value Ref Range   Sodium 141 135 - 145 mEq/L   Potassium 4.5 3.5 - 5.1 mEq/L   Chloride 107 96 - 112 mEq/L   CO2 27 19 - 32 mEq/L   Glucose, Bld 96 70 - 99 mg/dL   BUN 24 (H) 6 - 23 mg/dL   Creatinine, Ser 1.06 0.40 - 1.20 mg/dL   Total Bilirubin 0.3 0.2 - 1.2 mg/dL   Alkaline Phosphatase 60 39 - 117 U/L   AST 17 0 - 37 U/L   ALT 22 0 - 35 U/L   Total Protein 7.1 6.0 - 8.3 g/dL   Albumin 4.2 3.5 - 5.2 g/dL   Calcium 9.6 8.4 - 10.5 mg/dL   GFR 57.40 (L) >60.00 mL/min  Lipid panel  Result Value  Ref Range   Cholesterol 194 0 - 200 mg/dL   Triglycerides 85.0 0.0 - 149.0 mg/dL   HDL 60.50 >39.00 mg/dL   VLDL 17.0 0.0 - 40.0 mg/dL   LDL Cholesterol 116 (H) 0 - 99 mg/dL   Total CHOL/HDL Ratio 3    NonHDL 133.31   TSH  Result Value Ref Range   TSH 3.75 0.35 - 4.50 uIU/mL  T4, free  Result Value Ref Range   Free T4 0.92 0.60 - 1.60 ng/dL  Hepatitis C antibody  Result Value Ref Range   Hepatitis C Ab NON-REACTIVE NON-REACTI   SIGNAL TO CUT-OFF 0.01 <1.00  HIV antibody  Result Value Ref Range   HIV 1&2 Ab, 4th Generation NON-REACTIVE NON-REACTI    Assessment and Plan:   There are no diagnoses linked to this encounter.  . Reviewed expectations re: course of current medical issues. . Discussed self-management of symptoms. . Outlined signs and symptoms indicating need for more acute intervention. . Patient verbalized understanding and all questions were answered. Marland Kitchen Health Maintenance issues including appropriate healthy diet, exercise, and smoking avoidance were discussed with patient. . See orders for this visit as documented in the electronic medical record. . Patient received an After Visit Summary.  *** CMA served as Education administrator during this visit. History, Physical, and Plan performed by medical provider. The above documentation has been reviewed and is accurate and complete. Briscoe Deutscher, D.O.  Briscoe Deutscher, DO [place], [facility] 06/18/2018

## 2018-06-19 ENCOUNTER — Encounter: Payer: Self-pay | Admitting: Sports Medicine

## 2018-06-19 ENCOUNTER — Ambulatory Visit: Payer: 59 | Admitting: Sports Medicine

## 2018-06-19 ENCOUNTER — Ambulatory Visit: Payer: 59 | Admitting: Family Medicine

## 2018-06-19 VITALS — BP 120/74 | Ht 69.0 in | Wt 165.2 lb

## 2018-06-19 DIAGNOSIS — Z23 Encounter for immunization: Secondary | ICD-10-CM | POA: Diagnosis not present

## 2018-06-19 DIAGNOSIS — M24551 Contracture, right hip: Secondary | ICD-10-CM | POA: Diagnosis not present

## 2018-06-19 DIAGNOSIS — R29898 Other symptoms and signs involving the musculoskeletal system: Secondary | ICD-10-CM | POA: Diagnosis not present

## 2018-06-19 DIAGNOSIS — M25552 Pain in left hip: Secondary | ICD-10-CM

## 2018-06-19 DIAGNOSIS — M25551 Pain in right hip: Secondary | ICD-10-CM | POA: Diagnosis not present

## 2018-06-19 MED ORDER — DICLOFENAC SODIUM 2 % TD SOLN: 1.0000 "application " | Freq: Two times a day (BID) | TRANSDERMAL | 2 refills | Status: DC

## 2018-06-19 MED ORDER — DICLOFENAC SODIUM 2 % TD SOLN: 1.0000 "application " | Freq: Two times a day (BID) | TRANSDERMAL | 0 refills | Status: AC

## 2018-06-19 NOTE — Progress Notes (Signed)

## 2018-06-19 NOTE — Patient Instructions (Addendum)
Please perform the exercise program that we have prepared for you and gone over in detail on a daily basis.  In addition to the handout you were provided you can access your program through: [email or web address]   Your unique program code is:  [email or web address]   Pennsaid instructions: You have been given a sample/prescription for Pennsaid, a topical medication.     You are to apply this gel to your injured body part twice daily (morning and evening).   A little goes a long way so you can use about a pea-sized amount for each area.   Spread this small amount over the area into a thin film and let it dry.   Be sure that you do not rub the gel into your skin for more than 10 or 15 seconds otherwise it can irritate you skin.    Once you apply the gel, please do not put any other lotion or clothing in contact with that area for 30 minutes to allow the gel to absorb into your skin.   Some people are sensitive to the medication and can develop a sunburn-like rash.  If you have only mild symptoms it is okay to continue to use the medication but if you have any breakdown of your skin you should discontinue its use and please let us know.   If you have been written a prescription for Pennsaid, you will receive a pump bottle of this topical gel through a mail order pharmacy.  The instructions on the bottle will say to apply two pumps twice a day which may be too much gel for your particular area so use the pea-sized amount as your guide.   Instructions for Duexis, Pennsaid and Vimovo:  Your prescription will be filled through a participating [facility] mail order pharmacy.  You will receive a phone call or text from one of the participating pharmacies which can be located in any state in the [place].  You must communicate directly with them to have this medication filled.  When the pharmacy contacts you, they will need your mailing address (for shipment of the medication) andy they will need  payment information if you have a copay (typically no more than $10). If you have not heard from them 2-3 days after your appointment with Dr. Paulla Fore, contact [facility] directly at [phone].

## 2018-06-19 NOTE — Progress Notes (Signed)
[name]. Rig[place], [place] at [facility] [phone]  Maria Cobb - 52 y.o. female MRN [id number]  Date of birth: 1966/08/19  Visit Date: 06/19/2018  PCP: Briscoe Deutscher, DO   Referred by: Briscoe Deutscher, DO  Scribe(s) for today's visit: Josepha Pigg, CMA  SUBJECTIVE:  Maria Cobb is here for Follow-up ( B hip pain)  04/06/2018: Her B hip and leg pain (R>L)symptoms INITIALLY: Began in January 2019 after she had a cholecystectomy w/ no known MOI.  She started working out again after her surgery and felt like it resolved slightly.  She does cardio mainly to workout.  She saw Dr. Juleen China and was prescribed an anti-inflammatory which helped some but didn't completely resolve. Described as moderate bruising/fatigue-type pain, radiating to B lateral thighs Worsened with sitting and sleeping on her side Improved with Mobic Additional associated symptoms include: no N/T and no mechanical symptoms at the hips or back.  Does note some popping/clicking in her B knees   At this time symptoms are worsening compared to onset due to being more fatigued by the symptoms. She has been taking Mobic prescribed by Dr. Juleen China.  She's seen a chiropractor in the past w/ no relief.  Had hip/pelvis XR in March 2019 at chiropractor's office ([place]).  04/20/2018: Compared to the last office visit, her previously described symptoms are improving but not at 100%, feels like she is at about 80%.  Current symptoms are moderate & are radiating to to B thighs. She reports continued sleep disturbance. She has been doing HEP and foundation training with little trouble. At times she feels like she may not be doing the videos correctly. She feels like she gets a lot of relief from lying on the bed to stretch her hip flexor. She takes IBU prn with some relief.  She continues to see her chiropractor once weekly.   She has questions about  medications and diet that could be affecting inflammation.   05/18/2018: Compared to the last office visit on 04/20/18, her previously described B hip pain and weakness symptoms show no change.  She states that her symptoms vary depending on activity.  Yesterday was not a great day because she had to drive back from [place].  She notes that she is only taking the Elavil a few times/week due to side effects.  She states that she only feels a total of 20% improved. Current symptoms are moderate & are radiating to B hips and thighs.  She con't to have issues w/ sleep due to not being able to lay on one side for too long. She has been taking IBU and Elavil.  She goes to a Restaurant manager, fast food weekly.  She has been doing her HEP and [employer].  06/19/2018: Compared to the last office visit on 05/18/18, her previously described B hip symptoms are improving.  She continues to have issues w/ sleeping on her side not being able to stay on one side for too long.  She also notes increased symptoms after prolonged sitting. Current symptoms are mild & are radiating to B hips (R>L) She has been taking Duexis but only a half pill bid.  She con't to see a Restaurant manager, fast food.  She's been doing her HEP but not the [employer].  She con't to workout at the gym between cardio and weights.   REVIEW OF SYSTEMS: Reports night time disturbances. Reports night sweats. Denies unexplained weight loss. Reports personal history of skin cancer. Denies  changes in bowel or bladder habits. Denies recent unreported falls. Denies new or worsening dyspnea or wheezing. Denies headaches or dizziness.  Denies numbness, tingling or weakness  In the extremities.  Denies dizziness or presyncopal episodes Denies lower extremity edema    HISTORY:  Prior history reviewed and updated per electronic medical record.  Social History   Occupational History  . Occupation: Scientist, clinical (histocompatibility and immunogenetics): [employer]  Tobacco Use  . Smoking status: Never  Smoker  . Smokeless tobacco: Never Used  Substance and Sexual Activity  . Alcohol use: Yes    Comment: 1 weekly  . Drug use: No  . Sexual activity: Yes    Partners: Male    Birth control/protection: Pill   Social History   Social History Narrative  . Not on file    DATA OBTAINED & REVIEWED:  No results for input(s): HGBA1C, LABURIC, CREATINE in the last 8760 hours. 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   OBJECTIVE:  VS:  HT:5\' 9"  (175.3 cm)   WT:165 lb 3.2 oz (74.9 kg)  BMI:24.38    BP:120/74  HR: bpm  TEMP: ( )  RESP:    PHYSICAL EXAM: CONSTITUTIONAL: Well-developed, Well-nourished and In no acute distress PSYCHIATRIC: Alert & appropriately interactive. and Not depressed or anxious appearing. RESPIRATORY: No increased work of breathing and Trachea Midline EYES: Pupils are equal., EOM intact without nystagmus. and No scleral icterus.  Upper and Lower extremities: EXTREMITY EXAM: Warm and well perfused NEURO: unremarkable  MSK Exam: Bilateral hips and back  . Well aligned, no significant deformity. . No focal bony tenderness . No overlying skin changes.   RANGE OF MOTION & STRENGTH  . Normal flexion-extension of bilateral knees and hips.   SPECIALITY TESTING:   Log Roll: normal, no pain  FADIR: positive, mild pain and Minimally on the right, moderately on the left  FABER: normal, no pain  Stinchfield testing: normal, no pain Strength: Normal  Axial loading produces: Mild pain and No crepitation    ASSESSMENT   1. Left hip pain   2. Pain of both hip joints   3. Weakness of both hips   4. Right hip flexor tightness     PLAN:  Pertinent additional documentation may be included in corresponding procedure notes, imaging studies, problem based documentation and patient instructions.  Procedures:  . Osteopathic manipulation was performed today based on physical exam findings.  Please see  procedure note for further information including Osteopathic Exam findings  Medications:  Meds ordered this encounter  Medications  . Diclofenac Sodium (PENNSAID) 2 % SOLN    Sig: Place 1 application onto the skin 2 (two) times daily.    Dispense:  112 g    Refill:  2    Home Phone      [phone] Mobile          [phone]   . Diclofenac Sodium (PENNSAID) 2 % SOLN    Sig: Place 1 application onto the skin 2 (two) times daily for 1 day.    Dispense:  8 g    Refill:  0   Discussion/Instructions: No problem-specific Assessment & Plan notes found for this encounter.  . She does remain slightly tender over the lateral hip and nitroglycerin protocol could be considered in the future if any lack of improvement and/or diagnostic/therapeutic corticosteroid injection.  She is more focally tender over the ITB and will benefit from IT band stretching this was  reviewed with her today in detail. Faythe Ghee to continue with Duexis intermittently but also provided with prescription for Pennsaid today to be used topically over the lateral hip. . Discussed red flag symptoms that warrant earlier emergent evaluation and patient voices understanding. . Activity modifications and the importance of avoiding exacerbating activities (limiting pain to no more than a 4 / 10 during or following activity) recommended and discussed.  Follow-up:  . Return if symptoms worsen or fail to improve, for as needed for ongoing issues.   . If any lack of improvement consider: further diagnostic evaluation with Lumbar spine evaluation and/or MR arthrogram of the hip      CMA/ATC served as scribe during this visit. History, Physical, and Plan performed by medical provider. Documentation and orders reviewed and attested to.      Gerda Diss, [place] Sports Medicine Physician

## 2018-08-07 ENCOUNTER — Ambulatory Visit: Payer: 59 | Admitting: Family Medicine

## 2018-08-10 ENCOUNTER — Encounter: Payer: Self-pay | Admitting: Family Medicine

## 2018-09-12 ENCOUNTER — Other Ambulatory Visit: Payer: Self-pay | Admitting: Family Medicine

## 2018-12-10 ENCOUNTER — Other Ambulatory Visit: Payer: Self-pay | Admitting: Family Medicine

## 2019-03-08 ENCOUNTER — Other Ambulatory Visit: Payer: Self-pay | Admitting: Family Medicine

## 2019-03-21 LAB — LIPID PANEL
Cholesterol: 288 — AB (ref 0–200)
HDL: 47 (ref 35–70)
LDL Cholesterol: 130
Triglycerides: 116 (ref 40–160)

## 2019-03-25 LAB — BASIC METABOLIC PANEL: Glucose: 96

## 2019-03-25 LAB — LIPID PANEL
Cholesterol: 228 — AB (ref 0–200)
HDL: 74 — AB (ref 35–70)
LDL Cholesterol: 130
Triglycerides: 116 (ref 40–160)

## 2019-04-03 ENCOUNTER — Ambulatory Visit (INDEPENDENT_AMBULATORY_CARE_PROVIDER_SITE_OTHER): Payer: 59 | Admitting: Family Medicine

## 2019-04-03 ENCOUNTER — Other Ambulatory Visit: Payer: Self-pay

## 2019-04-03 ENCOUNTER — Encounter: Payer: Self-pay | Admitting: Family Medicine

## 2019-04-03 VITALS — BP 100/65 | HR 72 | Temp 98.3°F | Ht 69.0 in | Wt 166.0 lb

## 2019-04-03 DIAGNOSIS — E559 Vitamin D deficiency, unspecified: Secondary | ICD-10-CM | POA: Diagnosis not present

## 2019-04-03 DIAGNOSIS — K219 Gastro-esophageal reflux disease without esophagitis: Secondary | ICD-10-CM

## 2019-04-03 DIAGNOSIS — N951 Menopausal and female climacteric states: Secondary | ICD-10-CM | POA: Insufficient documentation

## 2019-04-03 DIAGNOSIS — L700 Acne vulgaris: Secondary | ICD-10-CM

## 2019-04-03 DIAGNOSIS — E039 Hypothyroidism, unspecified: Secondary | ICD-10-CM

## 2019-04-03 DIAGNOSIS — Z0000 Encounter for general adult medical examination without abnormal findings: Secondary | ICD-10-CM

## 2019-04-03 DIAGNOSIS — Z0001 Encounter for general adult medical examination with abnormal findings: Secondary | ICD-10-CM

## 2019-04-03 LAB — CBC WITH DIFFERENTIAL/PLATELET
Basophils Absolute: 0 10*3/uL (ref 0.0–0.1)
Basophils Relative: 0.5 % (ref 0.0–3.0)
Eosinophils Absolute: 0.1 10*3/uL (ref 0.0–0.7)
Eosinophils Relative: 1 % (ref 0.0–5.0)
HCT: 41.9 % (ref 36.0–46.0)
Hemoglobin: 14 g/dL (ref 12.0–15.0)
Lymphocytes Relative: 34.3 % (ref 12.0–46.0)
Lymphs Abs: 2 10*3/uL (ref 0.7–4.0)
MCHC: 33.4 g/dL (ref 30.0–36.0)
MCV: 88.9 fl (ref 78.0–100.0)
Monocytes Absolute: 0.6 10*3/uL (ref 0.1–1.0)
Monocytes Relative: 10.6 % (ref 3.0–12.0)
Neutro Abs: 3.1 10*3/uL (ref 1.4–7.7)
Neutrophils Relative %: 53.6 % (ref 43.0–77.0)
Platelets: 243 10*3/uL (ref 150.0–400.0)
RBC: 4.72 Mil/uL (ref 3.87–5.11)
RDW: 14.1 % (ref 11.5–15.5)
WBC: 5.8 10*3/uL (ref 4.0–10.5)

## 2019-04-03 LAB — COMPREHENSIVE METABOLIC PANEL
ALT: 19 U/L (ref 0–35)
AST: 21 U/L (ref 0–37)
Albumin: 4.4 g/dL (ref 3.5–5.2)
Alkaline Phosphatase: 62 U/L (ref 39–117)
BUN: 19 mg/dL (ref 6–23)
CO2: 29 mEq/L (ref 19–32)
Calcium: 9.8 mg/dL (ref 8.4–10.5)
Chloride: 104 mEq/L (ref 96–112)
Creatinine, Ser: 0.99 mg/dL (ref 0.40–1.20)
GFR: 58.18 mL/min — ABNORMAL LOW (ref >60.00)
Glucose, Bld: 89 mg/dL (ref 70–99)
Potassium: 4.2 mEq/L (ref 3.5–5.1)
Sodium: 140 mEq/L (ref 135–145)
Total Bilirubin: 0.6 mg/dL (ref 0.2–1.2)
Total Protein: 7 g/dL (ref 6.0–8.3)

## 2019-04-03 LAB — TSH: TSH: 5.09 u[IU]/mL — ABNORMAL HIGH (ref 0.35–4.50)

## 2019-04-03 LAB — T4, FREE: Free T4: 0.84 ng/dL (ref 0.60–1.60)

## 2019-04-03 LAB — VITAMIN D 25 HYDROXY (VIT D DEFICIENCY, FRACTURES): VITD: 36.24 ng/mL (ref 30.00–100.00)

## 2019-04-03 MED ORDER — MINOCYCLINE HCL 100 MG PO CAPS: 100.0000 mg | ORAL_CAPSULE | Freq: Every day | ORAL | 1 refills | Status: DC

## 2019-04-03 NOTE — Patient Instructions (Signed)
Iliotibial Band Syndrome  Iliotibial band syndrome (ITBS) is a condition that often causes knee pain. It can also cause pain in the outside of your hip, thigh, and knee. The iliotibial band is a strip of tissue that runs from the outside of your hip and down your thigh to the outside of your knee. Repeatedly bending and straightening your knee can irritate the iliotibial band. What are the causes? This condition is caused by inflammation and irritation from the friction of the iliotibial band moving over the thigh bone (femur) when you repeatedly bend and straighten your knee. What increases the risk? This condition is more likely to develop in people who:  Frequently change elevation during their workouts.  Run very long distances.  Recently increased the length or intensity of their workouts.  Run downhill often, or just started running downhill.  Ride a bike very far or often. You may also be at greater risk if you start a new workout routine without first warming up or if you have a job that requires you to bend, squat, or climb frequently. What are the signs or symptoms? Symptoms of this condition include:  Pain along the outside of your knee that may be worse with activity, especially running or going up and down stairs.  A "snapping" sensation over your knee.  Swelling on the outside of your knee.  Pain or a feeling of tightness in your hip. How is this diagnosed? This condition is diagnosed based on your symptoms, medical history, and physical exam. You may also see a health care provider who specializes in reducing pain and increasing mobility (physical therapist). A physical therapist may do an exam to check your balance, movement, and way of walking or running (gait) to see whether the way you move could contribute to your injury. You may also have tests to measure your strength, flexibility, and range of motion. How is this treated? Treatment for this condition includes:   Resting and limiting exercise.  Returning to activities gradually.  Doing range-of-motion and strengthening exercises (physical therapy) as told by your health care provider.  Including low-impact activities, such as swimming, in your exercise routine. Follow these instructions at home:  If directed, apply ice to the injured area. ? Put ice in a plastic bag. ? Place a towel between your skin and the bag. ? Leave the ice on for 20 minutes, 2-3 times per day.  Return to your normal activities as told by your health care provider. Ask your health care provider what activities are safe for you.  Keep all follow-up visits with your health care provider. This is important. Contact a health care provider if:  Your pain does not improve or gets worse despite treatment. This information is not intended to replace advice given to you by your health care provider. Make sure you discuss any questions you have with your health care provider. Document Released: 02/11/2002 Document Revised: 08/04/2017 Document Reviewed: 09/23/2016 Elsevier Patient Education  2020 [employer] Inc[employer]  

## 2019-04-03 NOTE — Progress Notes (Signed)
Subjective:    Maria Cobb is a 53 y.o. female and is here for a comprehensive physical exam.  G0P0.   1. Hip pain, bilaterally. Continues. Previous evaluation by Maria Cobb. Tried [employer] Pain bilateral hips, worse after sitting, radiates down lateral leg.  2. Postmenopausal. Hot flashes nightly.   3. Acne. Worse after stopping HRT. Controlled with Minocycline only.   4. Lifescreen. Pending. Lipid panel total 228, HDL 74, LDL 130, trig 116. BG 96.   5. GERD. Stable on Nexium. The patient denies any nausea, vomiting, reflux symptoms. She has no melena.   6. Hypothyroidism. Stable on thyroxine 28mcg for years. She denies any skin changes, weight gain or weight loss, or bowel issues.  Current Outpatient Medications:  .  fluocinonide ointment (LIDEX) 3.79 %, Apply 1 application topically 2 (two) times daily. (Patient taking differently: Apply 1 application topically 2 (two) times daily as needed (for rash). ), Disp: 30 g, Rfl: 0 .  levothyroxine (SYNTHROID, LEVOTHROID) 25 MCG tablet, Take 25 mcg by mouth at bedtime., Disp: , Rfl:  .  minocycline (MINOCIN) 100 MG capsule, Take 1 capsule (100 mg total) by mouth daily., Disp: 90 capsule, Rfl: 1 .  Multiple Vitamins-Minerals (MULTIVITAMIN ADULTS) TABS, Take 1 tablet by mouth daily., Disp: , Rfl:  .  pantoprazole (PROTONIX) 40 MG tablet, TAKE ONE TABLET BY MOUTH DAILY, Disp: 90 tablet, Rfl: 0  PMHx, SurgHx, SocialHx, Medications, and Allergies were reviewed in the Visit Navigator and updated as appropriate.   Past Medical History:  Diagnosis Date  . Diverticulosis   . GERD (gastroesophageal reflux disease)   . Heart murmur   . History of bilateral breast implants   . Hypothyroidism   . Lumbar spine strain   . Menorrhagia   . Mild intermittent asthma   . Postmenopausal HRT (hormone replacement therapy)   . SCC (squamous cell carcinoma)      Past Surgical History:  Procedure Laterality Date  .  CHOLECYSTECTOMY N/A 09/19/2017   Procedure: LAPAROSCOPIC CHOLECYSTECTOMY  WITH  INTRAOPERATIVE CHOLANGIOGRAM ERAS PATHWAY;  Surgeon: Maria Pickerel, MD;  Location: [facility];  Service: General;  Laterality: N/A;  . PLACEMENT OF BREAST IMPLANTS  1990     Family History  Problem Relation Age of Onset  . Heart disease Mother   . High Cholesterol Mother   . Stroke Maternal Grandfather   . Prostate cancer Father   . Colon cancer Neg Hx   . Esophageal cancer Neg Hx   . Rectal cancer Neg Hx     Social History   Tobacco Use  . Smoking status: Never Smoker  . Smokeless tobacco: Never Used  Substance Use Topics  . Alcohol use: Yes    Comment: 1 weekly  . Drug use: No    Review of Systems:   Pertinent items are noted in the HPI. Otherwise, ROS is negative.  Objective:   BP 100/65 (BP Location: Right Arm, Patient Position: Sitting, Cuff Size: Normal)   Pulse 72   Temp 98.3 F (36.8 C) (Oral)   Ht 5\' 9"  (1.753 m)   Wt 166 lb (75.3 kg)   SpO2 98%   BMI 24.51 kg/m   General appearance: alert, cooperative and appears stated age. Head: normocephalic, without obvious abnormality, atraumatic. Neck: no adenopathy, supple, symmetrical, trachea midline; thyroid not enlarged, symmetric, no tenderness/mass/nodules. Lungs: clear to auscultation bilaterally. Heart: regular rate and rhythm Abdomen: soft, non-tender; no masses,  no organomegaly. Extremities: extremities normal, atraumatic, no cyanosis or edema. Skin:  skin color, texture, turgor normal, no rashes or lesions. Lymph: cervical, supraclavicular, and axillary nodes normal; no abnormal inguinal nodes palpated. Neurologic: grossly normal. MSK: ttp bilateral hip bursa, tight IT, tight hamstrings                               Assessment/Plan:   Maria Cobb was seen today for annual exam.  Diagnoses and all orders for this visit:  Routine physical examination  Acquired hypothyroidism -     TSH -     T4, free  Gastroesophageal reflux  disease, esophagitis presence not specified -     CBC with Differential/Platelet -     Comprehensive metabolic panel  Acne vulgaris -     minocycline (MINOCIN) 100 MG capsule; Take 1 capsule (100 mg total) by mouth daily.  Vitamin D deficiency -     VITAMIN D 25 Hydroxy (Vit-D Deficiency, Fractures)  Vasomotor symptoms due to menopause Comments: Reviewed symptomatic care.   Patient Counseling: [x]    Nutrition: Stressed importance of moderation in sodium/caffeine intake, saturated fat and cholesterol, caloric balance, sufficient intake of fresh fruits, vegetables, fiber, calcium, iron, and 1 mg of folate supplement per day (for females capable of pregnancy).  [x]    Stressed the importance of regular exercise.   [x]    Substance Abuse: Discussed cessation/primary prevention of tobacco, alcohol, or other drug use; driving or other dangerous activities under the influence; availability of treatment for abuse.   [x]    Injury prevention: Discussed safety belts, safety helmets, smoke detector, smoking near bedding or upholstery.   [x]    Sexuality: Discussed sexually transmitted diseases, partner selection, use of condoms, avoidance of unintended pregnancy  and contraceptive alternatives.  [x]    Dental health: Discussed importance of regular tooth brushing, flossing, and dental visits.  [x]    Health maintenance and immunizations reviewed. Please refer to Health maintenance section.   Briscoe Deutscher, DO [place]

## 2019-04-05 ENCOUNTER — Encounter: Payer: Self-pay | Admitting: Family Medicine

## 2019-04-05 NOTE — Telephone Encounter (Signed)
Printed to give to Dr Juleen China

## 2019-04-05 NOTE — Telephone Encounter (Signed)
Printed and given to Dr. Juleen China to review.

## 2019-06-09 ENCOUNTER — Other Ambulatory Visit: Payer: Self-pay | Admitting: Family Medicine

## 2019-06-11 NOTE — Telephone Encounter (Signed)
Last fill 03/11/19  #90/0 Last OV 04/03/19

## 2019-09-05 ENCOUNTER — Other Ambulatory Visit: Payer: Self-pay | Admitting: Family Medicine

## 2019-09-07 ENCOUNTER — Encounter (INDEPENDENT_AMBULATORY_CARE_PROVIDER_SITE_OTHER): Payer: Self-pay | Admitting: Family Medicine

## 2019-09-07 DIAGNOSIS — L700 Acne vulgaris: Secondary | ICD-10-CM

## 2019-09-09 NOTE — Telephone Encounter (Signed)
Please review

## 2019-09-10 NOTE — Telephone Encounter (Signed)
Patient called and set up a TOC with Dr. Jerline Pain for 11/04/19

## 2019-09-19 NOTE — Telephone Encounter (Signed)
Last OV 04/03/19 with Dr. Juleen China Last refill 06/11/19 #90/0 Next OV 11/04/19 with Dr. Jerline Pain

## 2019-09-20 MED ORDER — MINOCYCLINE HCL 100 MG PO CAPS: 100.0000 mg | ORAL_CAPSULE | Freq: Every day | ORAL | 0 refills | Status: DC

## 2019-11-04 ENCOUNTER — Ambulatory Visit (INDEPENDENT_AMBULATORY_CARE_PROVIDER_SITE_OTHER): Payer: PRIVATE HEALTH INSURANCE | Admitting: Family Medicine

## 2019-11-04 DIAGNOSIS — L700 Acne vulgaris: Secondary | ICD-10-CM | POA: Diagnosis not present

## 2019-11-04 DIAGNOSIS — L111 Transient acantholytic dermatosis [Grover]: Secondary | ICD-10-CM | POA: Diagnosis not present

## 2019-11-04 DIAGNOSIS — K219 Gastro-esophageal reflux disease without esophagitis: Secondary | ICD-10-CM | POA: Diagnosis not present

## 2019-11-04 DIAGNOSIS — E039 Hypothyroidism, unspecified: Secondary | ICD-10-CM | POA: Diagnosis not present

## 2019-11-04 DIAGNOSIS — Z8249 Family history of ischemic heart disease and other diseases of the circulatory system: Secondary | ICD-10-CM

## 2019-11-04 MED ORDER — MINOCYCLINE HCL 100 MG PO CAPS: 100.0000 mg | ORAL_CAPSULE | Freq: Every day | ORAL | 3 refills | Status: DC

## 2019-11-04 MED ORDER — SACCHAROMYCES BOULARDII 250 MG PO CAPS: 250.0000 mg | ORAL_CAPSULE | Freq: Two times a day (BID) | ORAL | 3 refills | Status: AC

## 2019-11-04 NOTE — Assessment & Plan Note (Signed)
Stable.  Continue minocycline 100 mg daily.

## 2019-11-04 NOTE — Assessment & Plan Note (Signed)
-  Continue Synthroid 25 mcg daily 

## 2019-11-04 NOTE — Assessment & Plan Note (Signed)
Stable.  Continue fluocinonide ointment as needed.

## 2019-11-04 NOTE — Assessment & Plan Note (Signed)
Stable.  Continue Protonix 40 mg daily.  Consider switching back to Nexium 40 mg daily if Protonix is not effective.

## 2019-11-04 NOTE — Progress Notes (Signed)
Maria Cobb is a 54 y.o. female who presents today for a virtual office visit.  Assessment/Plan:  New/Acute Problems: Constipation/diarrhea Possibly due to chronic antibiotics for her acne.  Recommended good oral hydration.  Also recommended fiber supplementation.  Will start Florastor probiotic as well.  Discussed reasons to return to care.  Will need lab work including TSH if symptoms persist despite above.  Family history of cardiac disease We will send for cardiac CT testing per patient request.  Chronic Problems Addressed Today: No problem-specific Assessment & Plan notes found for this encounter.     Subjective:  HPI:  Patient has had issues with constipation for the past 6 months or so.  Symptoms initially started after she was taking Tylenol for her hip pain.  Since then she has had intermittent constipation and diarrhea.  She will go for 2 to 3 days without a bowel movement and then have to take it like to have a bowel movement.  This helps with constipation but then she will have diarrhea immediately afterwards.  No abdominal pain.  Symptoms are overall stable.  No melena or hematochezia.  No reported nausea or vomiting.  No other treatments tried.  Her stable, chronic medical conditions are outlined below:   # GERD - On protonix 40mg  daily and tolerating well  # Hypothyroidism - On 27mcg daily and tolerating well - Managed by gynecology  # Acne - On minocycline 100mg  daily  PMH:  The following were reviewed and entered/updated in epic: Past Medical History:  Diagnosis Date  . Diverticulosis   . GERD (gastroesophageal reflux disease)   . Heart murmur   . History of bilateral breast implants   . Hypothyroidism   . Lumbar spine strain   . Menorrhagia   . Mild intermittent asthma   . Postmenopausal HRT (hormone replacement therapy)   . SCC (squamous cell carcinoma)    Patient Active Problem List   Diagnosis Date Noted  . 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 Vasomotor symptoms due to menopause 04/03/2019  . Hypothyroidism 02/02/2017  . Gastroesophageal reflux disease 02/02/2017  . Acne vulgaris 02/02/2017  . History of bilateral breast implants    Past Surgical History:  Procedure Laterality Date  . CHOLECYSTECTOMY N/A 09/19/2017   Procedure: LAPAROSCOPIC CHOLECYSTECTOMY  WITH  INTRAOPERATIVE CHOLANGIOGRAM ERAS PATHWAY;  Surgeon: Greer Pickerel, MD;  Location: [facility] [place] [facility];  Service: General;  Laterality: N/A;  . PLACEMENT OF BREAST IMPLANTS  1990    Family History  Problem Relation Age of Onset  . Heart disease Mother   . High Cholesterol Mother   . Stroke Maternal Grandfather   . Prostate cancer Father   . Colon cancer Neg Hx   . Esophageal cancer Neg Hx   . Rectal cancer Neg Hx     Medications- reviewed and updated Current Outpatient Medications  Medication Sig Dispense Refill  . fluocinonide ointment (LIDEX) [id number] % Apply 1 application topically 2 (two) times daily. (Patient taking differently: Apply 1 application topically 2 (two) times daily as needed (for rash). ) 30 g 0  . levothyroxine (SYNTHROID, LEVOTHROID) 25 MCG tablet Take 25 mcg by mouth at bedtime.    . minocycline (MINOCIN) 100 MG capsule Take 1 capsule (100 mg total) by mouth daily. 90 capsule 3  . Multiple Vitamins-Minerals (MULTIVITAMIN ADULTS) TABS Take 1 tablet by mouth daily.    . pantoprazole (PROTONIX) 40 MG tablet TAKE ONE TABLET BY MOUTH DAILY 90 tablet 0  . saccharomyces boulardii (FLORASTOR) 250 MG capsule  Take 1 capsule (250 mg total) by mouth 2 (two) times daily. 180 capsule 3   No current facility-administered medications for this visit.    Allergies-reviewed and updated Allergies  Allergen Reactions  . Tape Rash    Social History   Socioeconomic History  . Marital status: Married    Spouse name: Not on file  . Number of children: 0  . Years of education: Not on file  . Highest education level: Not on file  Occupational History  .  Occupation: Scientist, clinical (histocompatibility and immunogenetics): [employer]  Tobacco Use  . Smoking status: Never Smoker  . Smokeless tobacco: Never Used  Substance and Sexual Activity  . Alcohol use: Yes    Comment: 1 weekly  . Drug use: No  . Sexual activity: Yes    Partners: Male    Birth control/protection: Pill  Other Topics Concern  . Not on file  Social History Narrative  . Not on file   Social Determinants of Health   Financial Resource Strain:   . Difficulty of Paying Living Expenses: Not on file  Food Insecurity:   . Worried About Charity fundraiser in the Last Year: Not on file  . Ran Out of Food in the Last Year: Not on file  Transportation Needs:   . Lack of Transportation (Medical): Not on file  . Lack of Transportation (Non-Medical): Not on file  Physical Activity:   . Days of Exercise per Week: Not on file  . Minutes of Exercise per Session: Not on file  Stress:   . Feeling of Stress : Not on file  Social Connections:   . Frequency of Communication with Friends and Family: Not on file  . Frequency of Social Gatherings with Friends and Family: Not on file  . Attends Religious Services: Not on file  . Active Member of Clubs or Organizations: Not on file  . Attends Archivist Meetings: Not on file  . Marital Status: Not on file          Objective/Observations  Physical Exam: Gen: NAD, resting comfortably Pulm: Normal work of breathing Neuro: Grossly normal, moves all extremities Psych: Normal affect and thought content  Virtual Visit via Video   I connected with Maria Cobb on 11/04/19 at  9:20 AM EST by a video enabled telemedicine application and verified that I am speaking with the correct person using two identifiers. The limitations of evaluation and management by telemedicine and the availability of in person appointments were discussed. The patient expressed understanding and agreed to proceed.   Patient location: Home Provider location: [place] [facility] Persons participating in the virtual visit: Myself and Patient  Time Spent: 42 minutes of total time was spent on the date of the encounter performing the following actions: chart review prior to seeing the patient, obtaining history, performing a medically necessary exam, counseling on the treatment plan, placing orders, and documenting in our EHR.       Algis Greenhouse. Jerline Pain, MD 11/04/2019 9:15 AM

## 2019-11-15 ENCOUNTER — Other Ambulatory Visit: Payer: PRIVATE HEALTH INSURANCE

## 2019-12-18 ENCOUNTER — Ambulatory Visit (INDEPENDENT_AMBULATORY_CARE_PROVIDER_SITE_OTHER)
Admission: RE | Admit: 2019-12-18 | Discharge: 2019-12-18 | Disposition: A | Discharge status: Home / Self Care | Payer: Self-pay | Source: Ambulatory Visit | Attending: Family Medicine | Admitting: Family Medicine

## 2019-12-18 ENCOUNTER — Other Ambulatory Visit: Payer: Self-pay

## 2019-12-18 DIAGNOSIS — Z8249 Family history of ischemic heart disease and other diseases of the circulatory system: Secondary | ICD-10-CM

## 2019-12-19 ENCOUNTER — Other Ambulatory Visit: Payer: Self-pay | Admitting: Family Medicine

## 2019-12-19 ENCOUNTER — Telehealth: Payer: Self-pay

## 2019-12-19 NOTE — Telephone Encounter (Signed)
Pt ct results given

## 2019-12-19 NOTE — Progress Notes (Signed)
Please inform patient of the following:  Her CT scan showed no signs of plaque build up. Would like for her to keep up the good work with diet and exercise. We can repeat the scan in about 5 year if she wishes.  Algis Greenhouse. Jerline Pain, MD 12/19/2019 9:31 AM

## 2019-12-19 NOTE — Telephone Encounter (Signed)
Patient requesting a call back regarding lab results.

## 2020-03-18 ENCOUNTER — Other Ambulatory Visit: Payer: Self-pay | Admitting: Family Medicine

## 2020-06-16 ENCOUNTER — Other Ambulatory Visit: Payer: Self-pay | Admitting: Family Medicine

## 2020-09-14 ENCOUNTER — Other Ambulatory Visit: Payer: Self-pay | Admitting: Family Medicine

## 2020-11-10 ENCOUNTER — Other Ambulatory Visit: Payer: Self-pay | Admitting: Family Medicine

## 2020-11-10 DIAGNOSIS — L700 Acne vulgaris: Secondary | ICD-10-CM

## 2020-12-13 ENCOUNTER — Other Ambulatory Visit: Payer: Self-pay | Admitting: Family Medicine

## 2021-02-08 ENCOUNTER — Other Ambulatory Visit: Payer: Self-pay | Admitting: Family Medicine

## 2021-02-08 DIAGNOSIS — L700 Acne vulgaris: Secondary | ICD-10-CM

## 2021-03-13 ENCOUNTER — Other Ambulatory Visit: Payer: Self-pay | Admitting: Family Medicine

## 2021-05-09 ENCOUNTER — Other Ambulatory Visit: Payer: Self-pay | Admitting: Family Medicine

## 2021-05-09 DIAGNOSIS — L700 Acne vulgaris: Secondary | ICD-10-CM

## 2021-06-11 ENCOUNTER — Other Ambulatory Visit: Payer: Self-pay | Admitting: Family Medicine

## 2021-08-07 ENCOUNTER — Other Ambulatory Visit: Payer: Self-pay | Admitting: Family Medicine

## 2021-08-07 DIAGNOSIS — L700 Acne vulgaris: Secondary | ICD-10-CM

## 2022-04-13 ENCOUNTER — Encounter (INDEPENDENT_AMBULATORY_CARE_PROVIDER_SITE_OTHER): Payer: Self-pay

## 2022-05-30 ENCOUNTER — Encounter: Payer: Self-pay | Admitting: *Deleted

## 2022-08-18 ENCOUNTER — Encounter: Payer: Self-pay | Admitting: *Deleted
